# Patient Record
Sex: Male | Born: 1950 | Race: Black or African American | Hispanic: No | Marital: Single | Smoking: Never smoker
Health system: Southern US, Community
[De-identification: ages and names within clinical notes are randomized; demographics above are authoritative.]

## PROBLEM LIST (undated history)

## (undated) DIAGNOSIS — I10 Essential (primary) hypertension: Secondary | ICD-10-CM

## (undated) DIAGNOSIS — M1990 Unspecified osteoarthritis, unspecified site: Secondary | ICD-10-CM

---

## 2002-06-12 ENCOUNTER — Encounter: Payer: Self-pay | Admitting: Emergency Medicine

## 2002-06-12 ENCOUNTER — Emergency Department (HOSPITAL_COMMUNITY): Admission: EM | Admit: 2002-06-12 | Discharge: 2002-06-12 | Payer: Self-pay | Admitting: Emergency Medicine

## 2003-09-07 ENCOUNTER — Emergency Department (HOSPITAL_COMMUNITY): Admission: EM | Admit: 2003-09-07 | Discharge: 2003-09-07 | Payer: Self-pay | Admitting: Emergency Medicine

## 2003-09-13 ENCOUNTER — Encounter: Admission: RE | Admit: 2003-09-13 | Discharge: 2003-09-13 | Payer: Self-pay | Admitting: General Surgery

## 2003-09-13 ENCOUNTER — Encounter: Payer: Self-pay | Admitting: General Surgery

## 2003-09-17 ENCOUNTER — Encounter: Payer: Self-pay | Admitting: General Surgery

## 2003-09-18 ENCOUNTER — Ambulatory Visit (HOSPITAL_COMMUNITY): Admission: RE | Admit: 2003-09-18 | Discharge: 2003-09-18 | Payer: Self-pay | Admitting: General Surgery

## 2008-01-03 ENCOUNTER — Inpatient Hospital Stay (HOSPITAL_COMMUNITY): Admission: RE | Admit: 2008-01-03 | Discharge: 2008-01-05 | Payer: Self-pay | Admitting: Internal Medicine

## 2008-08-05 ENCOUNTER — Emergency Department (HOSPITAL_COMMUNITY): Admission: EM | Admit: 2008-08-05 | Discharge: 2008-08-06 | Payer: Self-pay | Admitting: Emergency Medicine

## 2008-08-16 ENCOUNTER — Emergency Department (HOSPITAL_COMMUNITY): Admission: EM | Admit: 2008-08-16 | Discharge: 2008-08-16 | Payer: Self-pay | Admitting: Emergency Medicine

## 2008-09-08 ENCOUNTER — Emergency Department (HOSPITAL_COMMUNITY): Admission: EM | Admit: 2008-09-08 | Discharge: 2008-09-09 | Payer: Self-pay | Admitting: Emergency Medicine

## 2009-03-11 ENCOUNTER — Emergency Department (HOSPITAL_COMMUNITY): Admission: EM | Admit: 2009-03-11 | Discharge: 2009-03-11 | Payer: Self-pay | Admitting: Emergency Medicine

## 2009-03-12 ENCOUNTER — Emergency Department (HOSPITAL_COMMUNITY): Admission: EM | Admit: 2009-03-12 | Discharge: 2009-03-13 | Payer: Self-pay | Admitting: Emergency Medicine

## 2009-10-06 IMAGING — CT CT ABDOMEN W/ CM
2 of 5 series · 13 of 32 positions shown, 18 images · IV contrast (water/omni  & 100 ML OMNI 300)
Comparison: None

CT ABDOMEN

CLINICAL DATA: Abdominal pain.

CT ABDOMEN AND PELVIS WITH CONTRAST
TECHNIQUE: Multidetector CT imaging of the abdomen and pelvis was
performed using the standard protocol following bolus
administration of intravenous contrast.
Contrast: 100 ml [id number]

[Series 2: routine abdomen · axial · 0.82mm/px · z∈[-426,-131]mm · 5 of 89 slices shown, 10 images]
[im 15/89  soft-tissue]
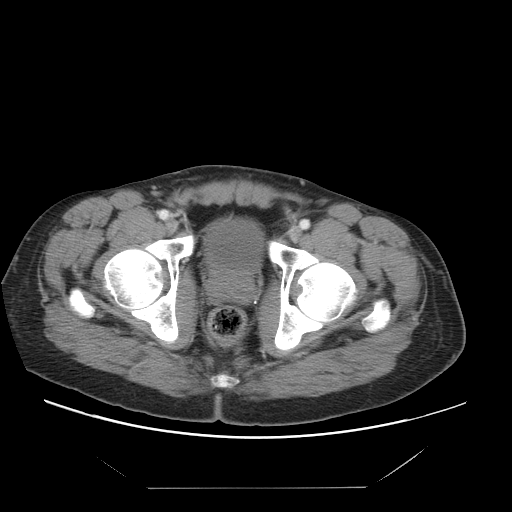
[im 15/89  bone]
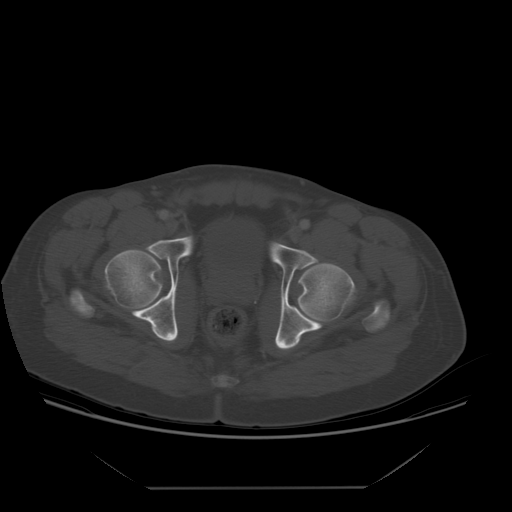
[im 30/89  soft-tissue]
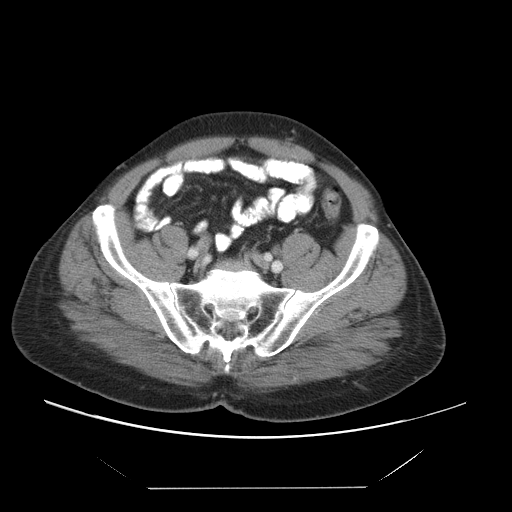
[im 30/89  lung]
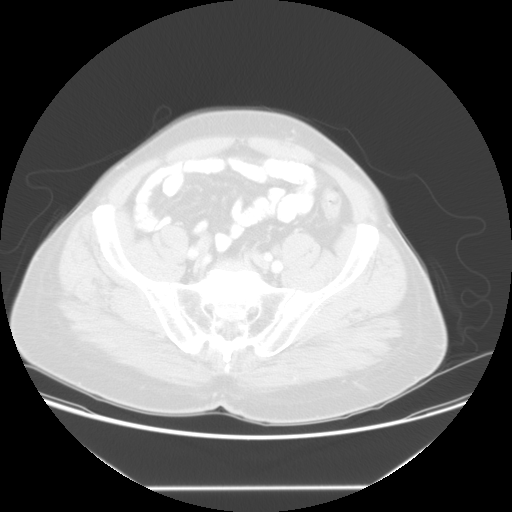
[im 45/89  soft-tissue]
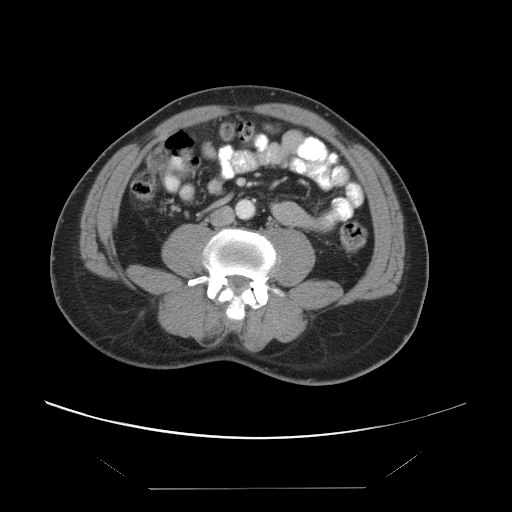
[im 45/89  lung]
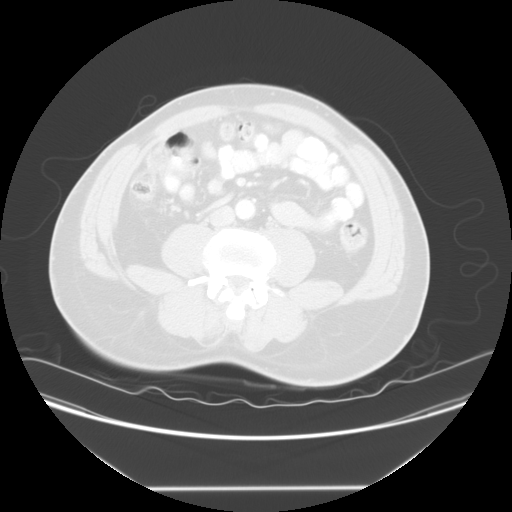
[im 59/89  soft-tissue]
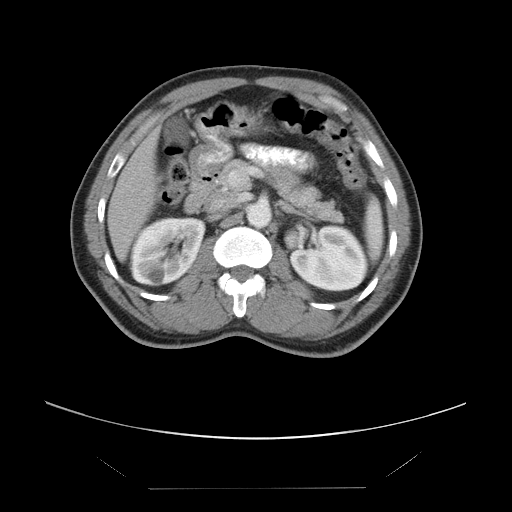
[im 59/89  lung]
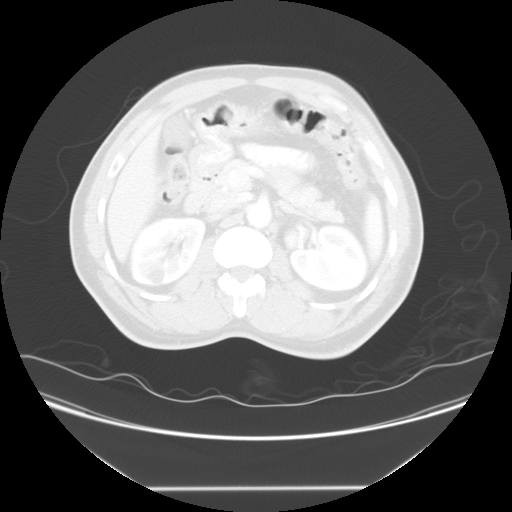
[im 74/89  soft-tissue]
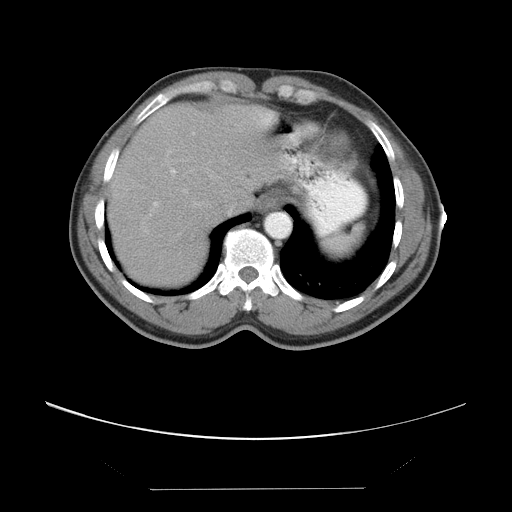
[im 74/89  lung]
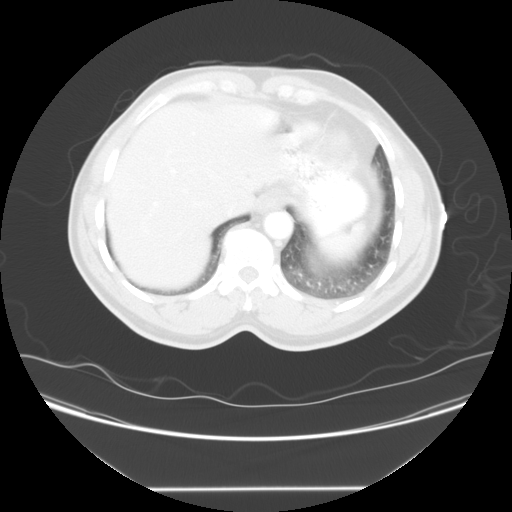

[Series 400: reformatted · sagittal · 0.89mm/px · 8 of 115 slices shown]
[im 13/115  soft-tissue]
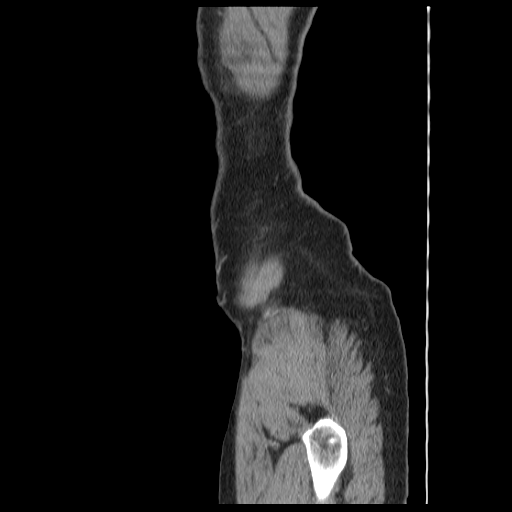
[im 26/115  soft-tissue]
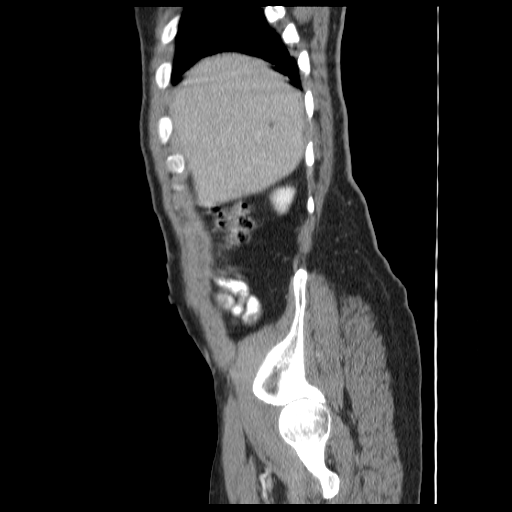
[im 39/115  soft-tissue]
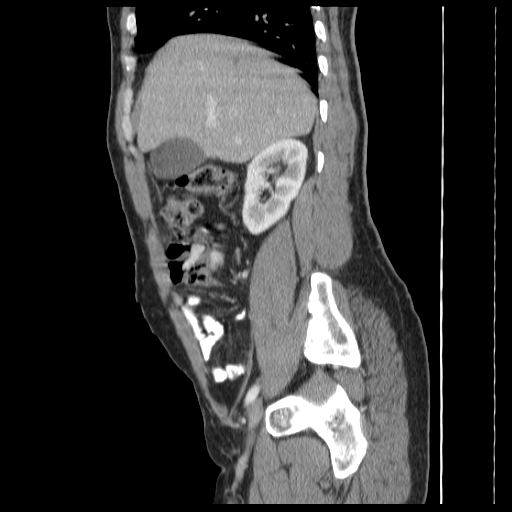
[im 51/115  soft-tissue]
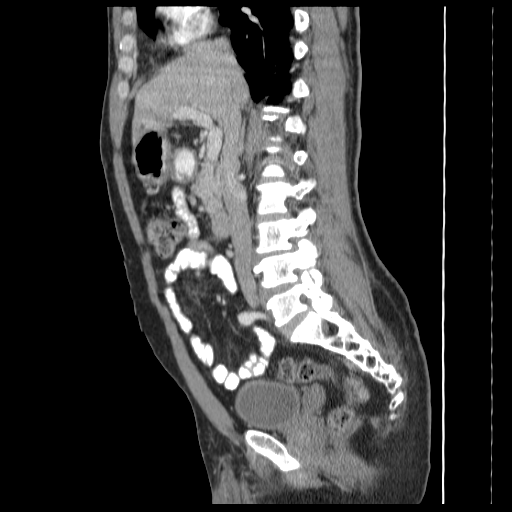
[im 64/115  soft-tissue]
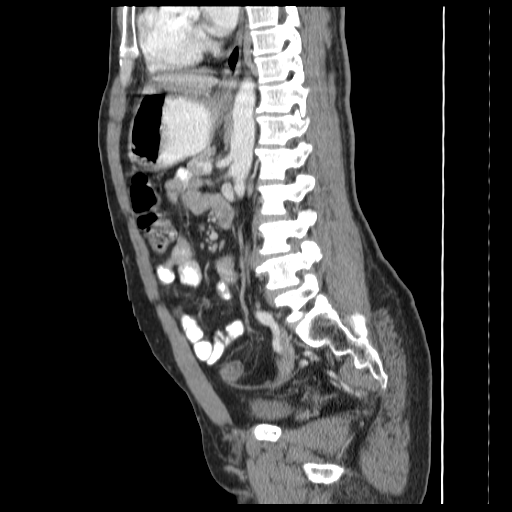
[im 77/115  soft-tissue]
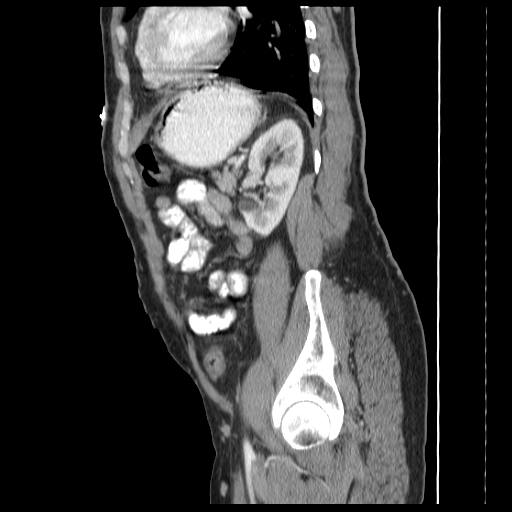
[im 89/115  soft-tissue]
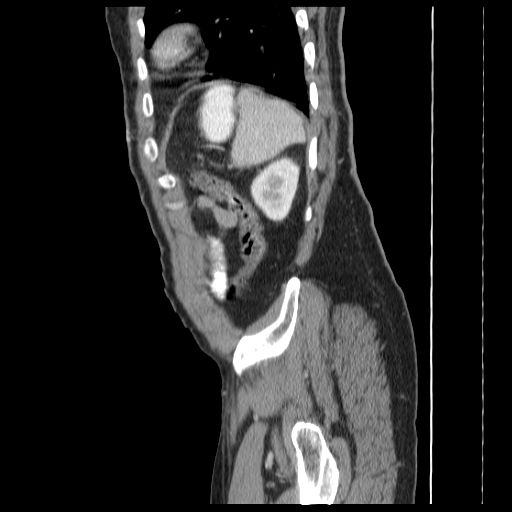
[im 102/115  soft-tissue]
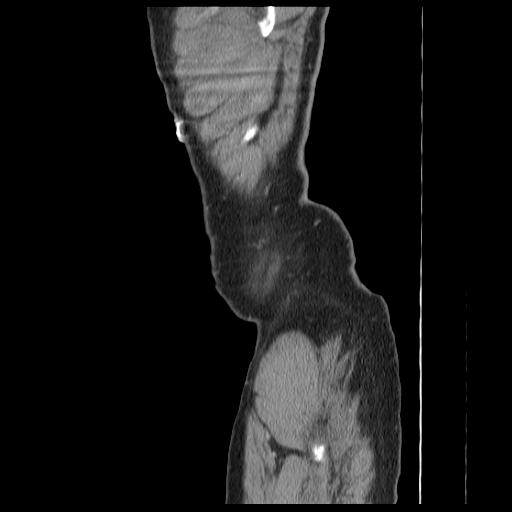

[13 of 32 positions shown; findings below may reference images not displayed]

FINDINGS: Low density areas noted within the liver and both
kidneys, most compatible with cysts.  Spleen, pancreas, adrenals
unremarkable.  Gallbladder grossly unremarkable.

Within the lower abdomen, there is a loop of small bowel which has
apparent area of wall thickening, seen best on image 48.  This
persists on the delayed renal images.  No evidence of obstruction.
No free air or adenopathy.

Minimal dependent atelectasis in the lung bases.  No effusions.  No
acute bony abnormality.
IMPRESSION: Questionable area of focal wall thickening in mid small bowel,
within the lower abdomen on image 48.  This persists on renal
delayed images.  Considerations would include area of focal
enteritis, stricture, less likely mass.  Consider follow-up with CT
enterography after oral contrast material has cleared.

Bilateral renal and hepatic cysts.

CT PELVIS
FINDINGS: Appendix is visualized and is normal.  No free fluid,
free air, or adenopathy.  Bladder grossly unremarkable.  No acute
bony abnormality.
IMPRESSION: No acute findings in the pelvis.

## 2010-01-16 ENCOUNTER — Emergency Department (HOSPITAL_COMMUNITY): Admission: EM | Admit: 2010-01-16 | Discharge: 2010-01-17 | Payer: Self-pay | Admitting: Emergency Medicine

## 2010-05-29 ENCOUNTER — Ambulatory Visit: Payer: Self-pay | Admitting: Internal Medicine

## 2010-07-07 ENCOUNTER — Ambulatory Visit: Payer: Self-pay | Admitting: Family Medicine

## 2010-07-07 LAB — CONVERTED CEMR LAB
ALT: 18 units/L (ref 0–53)
AST: 18 units/L (ref 0–37)
Alkaline Phosphatase: 44 units/L (ref 39–117)
Basophils Absolute: 0 10*3/uL (ref 0.0–0.1)
Basophils Relative: 0 % (ref 0–1)
Calcium: 9.2 mg/dL (ref 8.4–10.5)
Chloride: 105 meq/L (ref 96–112)
Creatinine, Ser: 0.79 mg/dL (ref 0.40–1.50)
Eosinophils Absolute: 0.2 10*3/uL (ref 0.0–0.7)
MCHC: 32.6 g/dL (ref 30.0–36.0)
MCV: 80.1 fL (ref 78.0–100.0)
Monocytes Absolute: 0.5 10*3/uL (ref 0.1–1.0)
Neutro Abs: 2.3 10*3/uL (ref 1.7–7.7)
Neutrophils Relative %: 49 % (ref 43–77)
Potassium: 4.1 meq/L (ref 3.5–5.3)
RDW: 15.1 % (ref 11.5–15.5)

## 2010-08-20 ENCOUNTER — Ambulatory Visit: Payer: Self-pay | Admitting: Internal Medicine

## 2011-03-04 LAB — URINALYSIS, ROUTINE W REFLEX MICROSCOPIC
Bilirubin Urine: NEGATIVE
Leukocytes, UA: NEGATIVE
Nitrite: NEGATIVE
Specific Gravity, Urine: 1.023 (ref 1.005–1.030)
Urobilinogen, UA: 0.2 mg/dL (ref 0.0–1.0)
pH: 8 (ref 5.0–8.0)

## 2011-03-04 LAB — URINE MICROSCOPIC-ADD ON

## 2011-03-04 LAB — COMPREHENSIVE METABOLIC PANEL
Albumin: 3.8 g/dL (ref 3.5–5.2)
Alkaline Phosphatase: 51 U/L (ref 39–117)
BUN: 11 mg/dL (ref 6–23)
CO2: 25 mEq/L (ref 19–32)
Chloride: 102 mEq/L (ref 96–112)
Creatinine, Ser: 0.88 mg/dL (ref 0.4–1.5)
GFR calc non Af Amer: >60 mL/min (ref >60)
Glucose, Bld: 127 mg/dL — ABNORMAL HIGH (ref 70–99)
Potassium: 3.8 mEq/L (ref 3.5–5.1)
Total Bilirubin: 0.9 mg/dL (ref 0.3–1.2)

## 2011-03-04 LAB — CBC
HCT: 49.1 % (ref 39.0–52.0)
Hemoglobin: 16.4 g/dL (ref 13.0–17.0)
MCV: 82.3 fL (ref 78.0–100.0)
RBC: 5.97 MIL/uL — ABNORMAL HIGH (ref 4.22–5.81)
WBC: 6.3 10*3/uL (ref 4.0–10.5)

## 2011-03-04 LAB — DIFFERENTIAL
Basophils Absolute: 0 10*3/uL (ref 0.0–0.1)
Basophils Relative: 1 % (ref 0–1)
Eosinophils Relative: 1 % (ref 0–5)
Lymphocytes Relative: 23 % (ref 12–46)
Monocytes Absolute: 0.6 10*3/uL (ref 0.1–1.0)
Neutro Abs: 4.1 10*3/uL (ref 1.7–7.7)

## 2011-03-04 LAB — POCT CARDIAC MARKERS
CKMB, poc: 3.5 ng/mL (ref 1.0–8.0)
Myoglobin, poc: 95.2 ng/mL (ref 12–200)

## 2011-03-04 LAB — URINE CULTURE

## 2011-03-04 LAB — LIPASE, BLOOD: Lipase: 26 U/L (ref 11–59)

## 2011-03-26 LAB — CBC
HCT: 47.8 % (ref 39.0–52.0)
MCHC: 32.9 g/dL (ref 30.0–36.0)
MCV: 80.3 fL (ref 78.0–100.0)
RBC: 5.95 MIL/uL — ABNORMAL HIGH (ref 4.22–5.81)
WBC: 7 10*3/uL (ref 4.0–10.5)

## 2011-03-26 LAB — DIFFERENTIAL
Basophils Absolute: 0 10*3/uL (ref 0.0–0.1)
Eosinophils Relative: 1 % (ref 0–5)
Lymphocytes Relative: 23 % (ref 12–46)
Neutro Abs: 4.5 10*3/uL (ref 1.7–7.7)
Neutrophils Relative %: 65 % (ref 43–77)

## 2011-03-26 LAB — COMPREHENSIVE METABOLIC PANEL
BUN: 12 mg/dL (ref 6–23)
CO2: 26 mEq/L (ref 19–32)
Calcium: 9.7 mg/dL (ref 8.4–10.5)
Chloride: 104 mEq/L (ref 96–112)
Creatinine, Ser: 0.79 mg/dL (ref 0.4–1.5)
GFR calc non Af Amer: >60 mL/min (ref >60)
Glucose, Bld: 126 mg/dL — ABNORMAL HIGH (ref 70–99)
Total Bilirubin: 0.8 mg/dL (ref 0.3–1.2)

## 2011-03-26 LAB — URINALYSIS, ROUTINE W REFLEX MICROSCOPIC
Bilirubin Urine: NEGATIVE
Glucose, UA: NEGATIVE mg/dL
Hgb urine dipstick: NEGATIVE
Specific Gravity, Urine: 1.01 (ref 1.005–1.030)

## 2011-03-26 LAB — LIPASE, BLOOD: Lipase: 28 U/L (ref 11–59)

## 2011-04-28 NOTE — Discharge Summary (Signed)
NAMEDAHLTON, HINDE NO.:  [id number]   MEDICAL RECORD NO.:  [id number]          PATIENT TYPE:  INP   LOCATION:  1426                         FACILITY:  [facility]   PHYSICIAN:  Altha Harm, MDDATE OF BIRTH:  23-Jun-1951   DATE OF ADMISSION:  01/03/2008  DATE OF DISCHARGE:  01/05/2008                               DISCHARGE SUMMARY   DISCHARGE DISPOSITION:  Home.   FINAL DISCHARGE DIAGNOSIS:  Coffee-grounds emesis, likely gastritis.   DISCHARGE DIAGNOSIS:  Protonix 40 mg p.o. b.i.d.   CONSULTANT:  None.   PROCEDURES:  None.   DIAGNOSTIC STUDIES:  None.   CHIEF COMPLAINT:  Coffee-grounds emesis.   ALLERGIES:  No known drug allergies.   HISTORY OF PRESENT ILLNESS:  Please see the H&P dictated by Dr. Mikeal Hawthorne  for details of the HPI.   However, in short, this is a gentleman from [place] who was sent as a  direct admission by Dr. Mikeal Hawthorne after he presented with complaints of  coffee-grounds emesis.   HOSPITAL COURSE:  The patient was admitted and placed on bowel rest.  His hemoglobin was serially monitored over 24 hours.  His hemoglobins  remained stable at 14.  He was started on a diet and the patient's diet  was advanced to a heart-healthy diet.  The patient has tolerated the  diet without any evidence of bleeding and without any hemodynamic  compromise.  The patient's condition has been discussed with Dr. Mikeal Hawthorne,  who at this time will have the patient worked up as gastritis as an  outpatient.   DISCHARGE MEDICATIONS:  The patient is being discharged home on Protonix  p.o. b.i.d.   FOLLOWUP:  He is to follow up with Dr. Mikeal Hawthorne in 3-5 days and the plan is  for him to have a gastrointestinal outpatient workup for his condition.   DIETARY RESTRICTIONS:  None.   PHYSICAL RESTRICTIONS:  None.      Altha Harm, MD  Electronically Signed     MAM/MEDQ  D:  01/05/2008  T:  01/05/2008  Job:  [id number]   cc:   Lonia Blood, M.D.

## 2011-04-28 NOTE — H&P (Signed)
NAMEQUINDON, DENKER NO.:  [id number]   MEDICAL RECORD NO.:  [id number]          PATIENT TYPE:  INP   LOCATION:  1426                         FACILITY:  [facility]   PHYSICIAN:  Lonia Blood, M.D.      DATE OF BIRTH:  1951/10/22   DATE OF ADMISSION:  01/03/2008  DATE OF DISCHARGE:                              HISTORY & PHYSICAL   PRIMARY CARE PHYSICIAN:  Dr. Mikeal Hawthorne.   PRESENTING COMPLAINT:  Nausea, vomiting and hematemesis.   HISTORY OF PRESENT ILLNESS:  The patient is a 60 year old gentleman with  past history of inguinal hernia status post repair in 2004 who presented  with persistent abdominal pain, nausea and vomiting.  The patient has  been taking Pepto-Bismol apparently for abdominal pain.  He has noticed  continuous blood  in his vomitus.  He has become so weak today that he  is unable to walk around or move around.  The patient was brought in by  his friends who had to hold him.  He was admitted to the hospital for  further management.   PAST MEDICAL HISTORY:  Significant only for hernia repair and recurrent  abdominal pain.   ALLERGIES:  He has NO KNOWN DRUG ALLERGIES.   MEDICATIONS:  None.   SOCIAL HISTORY:  The patient is married with two wives.  Denied any  tobacco, alcohol or IV drug use.  He is originally from [place].   FAMILY HISTORY:  Denied any significant history of cancer, diabetes,  hypertension.   REVIEW OF SYSTEMS:  A 12 point review of systems is negative except per  HPI.   PHYSICAL EXAMINATION:  The patient is afebrile with temperature 98.3,  blood pressure 130/84, pulse 88, respiratory rate 16 with sats 99% on  room air.  GENERAL:  The patient looks acutely ill looking in no acute distress.  HEENT: PERRL.  EOMI.  NECK:  Supple.  No JVD, no lymphadenopathy.  RESPIRATORY:  He has good air entry bilaterally.  No wheezes or rales.  CARDIOVASCULAR:  The patient has S1-S2, no murmurs.  ABDOMEN:  Flat, soft with some mild epigastric  tenderness with some  positive bowel sounds.  EXTREMITIES:  No edema, cyanosis or clubbing.   LABORATORY DATA:  Labs showed sodium 134, potassium 4.1, chloride 101,  CO2 27, glucose 125, BUN 11, creatinine 0.92 and calcium 9.0.  White  count is 8.5, hemoglobin 15.9, platelet count 213.   ASSESSMENT:  Therefore this is a 60 year old gentleman presenting with  nausea, vomiting, abdominal pain.  Also reported some elements of  hematemesis.   PLAN:  Therefore will be:  1. Nausea and vomiting.  This most likely represents some form of      gastritis.  The patient has been having GI symptoms and taking      Pepto-Bismol which has some aspirin.  This may therefore reflect a      case of NSAID induced gastritis or esophagitis.  At this point will      admit him.  Start Phenergan p.r.n., IV fluids and keep him n.p.o.  We will gradually start him on clear liquids and advance his diet.  2. Reported hematemesis.  The patient is currently guaiac negative.      His hemoglobin is 15.9 showing probably dehydration.  We will      follow serial him CBCs.  If he showed any hematemesis in the      hospital or if his hemoglobin drops significantly will get GI for      EGD.  In the meantime, however, we will put him on Protonix p.o.      b.i.d. and continue with pain control.  3. Dehydration.  Again, we will hydrate the patient adequately until      discharge.      Lonia Blood, M.D.  Electronically Signed     LG/MEDQ  D:  01/04/2008  T:  01/05/2008  Job:  [id number]

## 2011-05-01 NOTE — Op Note (Signed)
Gary Quinn, Gary Quinn NO.:  [id number]   MEDICAL RECORD NO.:  [id number]                   PATIENT TYPE:  AMB   LOCATION:  DAY                                  FACILITY:  [facility]   PHYSICIAN:  Sharlet Salina T. Hoxworth, M.D.          DATE OF BIRTH:  [birth year]   DATE OF PROCEDURE:  09/18/2003  DATE OF DISCHARGE:                                 OPERATIVE REPORT   PREOPERATIVE DIAGNOSIS:  Right inguinal hernia.   POSTOPERATIVE DIAGNOSIS:  Right inguinal hernia.   OPERATION/PROCEDURE:  Repair of right inguinal hernia with mesh.   SURGEON:  Lorne Skeens. Hoxworth, M.D.   ANESTHESIA:  Laryngeal mask general.   BRIEF HISTORY:  [name] is a 60 year old black male from [place] who presents  with a three-month history of right groin pain worse with activity.  Exam  reveals a right inguinal hernia.  Repair with general anesthesia with mesh  has been recommended and accepted.  The nature of the procedure,  indications, risks of bleeding, infection, recurrence was discussed and  understood.  He now comes to the operating room for this procedure.  The  patient was brought to the operating room and placed in the supine position  on the operating table and laryngeal mask general anesthesia was induced.  Preoperative antibiotics were given intravenously.  The right groin was  sterilely prepped and draped.  Oblique incision was made in the groin.  Dissection was carried down through the subcutaneous tissue using cautery.  External oblique was identified and divided along the lines of its fibers  through the external ring.  The cord was dissected off the floor of the  pubic tubercle and completely dissected free back up to the internal ring  dividing cremasteric fibers bilaterally.  Dissection within the cord  revealed a good sized herniated portion of retroperitoneal fat or cord  lipoma and this was completely dissected free from the cord structures up to  and above the  level of the internal ring where it was clamped, divided and  tied with 3-0 Vicryl.  Careful dissection within the cord showed no evidence  of an indirect sac and the floor was intact.  A piece of Parietex mesh was  trimmed to size to fit the floor of the inguinal canal, with tails around  the cord of the internal ring.  It was sutured to the pubic tubercle and  then to the inguinal ligament working medial to lateral with running 2-0  Prolene, medially sutured to the edge of the rectus sheath with interrupted  2-0 Prolene.  The tails were then tacked together laterally to the cord  with interrupted 2-0 Prolene.  The soft tissue was infiltrated with  Marcaine.  The cord and ilioinguinal nerves were returned to the anatomic  position.  The external oblique was returned to their anatomic positions.  The external oblique was closed with plain 3-0 Vicryl.  Scarpa's fascia was  closed with plain 3-0 Vicryl.  The skin was closed with running subcuticular  4-0 Monocryl and Dermabond.  Sponge and needle counts were correct.  Dry  dressing was applied and the patient was taken to recovery in good  condition.                                                 Lorne Skeens. Hoxworth, M.D.    Tory Emerald  D:  09/18/2003  T:  09/18/2003  Job:  [id number]

## 2011-07-14 ENCOUNTER — Emergency Department (HOSPITAL_COMMUNITY): Payer: Self-pay

## 2011-07-14 ENCOUNTER — Emergency Department (HOSPITAL_COMMUNITY)
Admission: EM | Admit: 2011-07-14 | Discharge: 2011-07-14 | Disposition: A | Discharge status: Home / Self Care | Payer: Self-pay | Attending: Emergency Medicine | Admitting: Emergency Medicine

## 2011-07-14 DIAGNOSIS — Z79899 Other long term (current) drug therapy: Secondary | ICD-10-CM | POA: Insufficient documentation

## 2011-07-14 DIAGNOSIS — R071 Chest pain on breathing: Secondary | ICD-10-CM | POA: Insufficient documentation

## 2011-07-14 LAB — DIFFERENTIAL
Basophils Absolute: 0 10*3/uL (ref 0.0–0.1)
Basophils Relative: 0 % (ref 0–1)
Eosinophils Absolute: 0.1 10*3/uL (ref 0.0–0.7)
Eosinophils Relative: 3 % (ref 0–5)
Lymphocytes Relative: 42 % (ref 12–46)
Lymphs Abs: 2.1 10*3/uL (ref 0.7–4.0)
Monocytes Absolute: 0.5 10*3/uL (ref 0.1–1.0)
Monocytes Relative: 11 % (ref 3–12)
Neutro Abs: 2.3 10*3/uL (ref 1.7–7.7)
Neutrophils Relative %: 45 % (ref 43–77)

## 2011-07-14 LAB — CBC
HCT: 42.9 % (ref 39.0–52.0)
Hemoglobin: 14.6 g/dL (ref 13.0–17.0)
MCH: 26.4 pg (ref 26.0–34.0)
MCHC: 34 g/dL (ref 30.0–36.0)
MCV: 77.6 fL — ABNORMAL LOW (ref 78.0–100.0)
Platelets: 164 10*3/uL (ref 150–400)
RBC: 5.53 MIL/uL (ref 4.22–5.81)
RDW: 14 % (ref 11.5–15.5)
WBC: 5.1 10*3/uL (ref 4.0–10.5)

## 2011-07-14 LAB — COMPREHENSIVE METABOLIC PANEL
Albumin: 3.6 g/dL (ref 3.5–5.2)
BUN: 17 mg/dL (ref 6–23)
Creatinine, Ser: 0.87 mg/dL (ref 0.50–1.35)
Potassium: 3.6 mEq/L (ref 3.5–5.1)
Total Protein: 7.2 g/dL (ref 6.0–8.3)

## 2011-07-14 LAB — CK TOTAL AND CKMB (NOT AT ARMC)
CK, MB: 7.4 ng/mL (ref 0.3–4.0)
Relative Index: 2.2 (ref 0.0–2.5)

## 2011-07-14 MED ORDER — IOHEXOL 300 MG/ML  SOLN
75.0000 mL | Freq: Once | INTRAMUSCULAR | Status: AC | PRN
  Administered 2011-07-14: 75 mL via INTRAVENOUS

## 2011-09-03 LAB — CBC
HCT: 42.7
HCT: 43.2
HCT: 43.9
Hemoglobin: 14.4
Hemoglobin: 14.8
Hemoglobin: 15.9
MCV: 77.4 — ABNORMAL LOW
MCV: 78.3
Platelets: 193
Platelets: 206
Platelets: 214
RBC: 6.06 — ABNORMAL HIGH
RDW: 14.9
RDW: 14.9
RDW: 15
WBC: 4.4
WBC: 7.1
WBC: 8.5

## 2011-09-03 LAB — BASIC METABOLIC PANEL
BUN: 8
CO2: 27
Chloride: 101
Chloride: 102
GFR calc Af Amer: >60
GFR calc non Af Amer: >60
Glucose, Bld: 141 — ABNORMAL HIGH
Potassium: 3.4 — ABNORMAL LOW
Potassium: 3.9
Sodium: 134 — ABNORMAL LOW
Sodium: 139

## 2011-09-03 LAB — CROSSMATCH: Antibody Screen: NEGATIVE

## 2011-09-03 LAB — ABO/RH: ABO/RH(D): A POS

## 2011-09-16 LAB — COMPREHENSIVE METABOLIC PANEL
ALT: 12
Alkaline Phosphatase: 54
CO2: 26
Calcium: 9.4
Chloride: 105
GFR calc non Af Amer: >60
Glucose, Bld: 96
Potassium: 3.8
Sodium: 139
Total Bilirubin: 0.4

## 2011-09-16 LAB — URINALYSIS, ROUTINE W REFLEX MICROSCOPIC
Bilirubin Urine: NEGATIVE
Hgb urine dipstick: NEGATIVE
Nitrite: NEGATIVE
Specific Gravity, Urine: 1.023
pH: 5.5

## 2011-09-16 LAB — CBC
Hemoglobin: 14.6
MCHC: 33.3
RBC: 5.43
WBC: 5.2

## 2011-09-16 LAB — DIFFERENTIAL
Basophils Absolute: 0
Basophils Relative: 1
Eosinophils Absolute: 0.3
Neutrophils Relative %: 54

## 2011-09-16 LAB — LIPASE, BLOOD: Lipase: 27

## 2013-04-03 ENCOUNTER — Emergency Department (HOSPITAL_COMMUNITY)
Admission: EM | Admit: 2013-04-03 | Discharge: 2013-04-03 | Disposition: A | Discharge status: Home / Self Care | Payer: No Typology Code available for payment source | Attending: Emergency Medicine | Admitting: Emergency Medicine

## 2013-04-03 ENCOUNTER — Encounter (HOSPITAL_COMMUNITY): Payer: Self-pay | Admitting: *Deleted

## 2013-04-03 DIAGNOSIS — I10 Essential (primary) hypertension: Secondary | ICD-10-CM | POA: Insufficient documentation

## 2013-04-03 DIAGNOSIS — R110 Nausea: Secondary | ICD-10-CM | POA: Insufficient documentation

## 2013-04-03 DIAGNOSIS — R197 Diarrhea, unspecified: Secondary | ICD-10-CM | POA: Insufficient documentation

## 2013-04-03 DIAGNOSIS — R1084 Generalized abdominal pain: Secondary | ICD-10-CM | POA: Insufficient documentation

## 2013-04-03 DIAGNOSIS — Z8739 Personal history of other diseases of the musculoskeletal system and connective tissue: Secondary | ICD-10-CM | POA: Insufficient documentation

## 2013-04-03 DIAGNOSIS — R51 Headache: Secondary | ICD-10-CM | POA: Insufficient documentation

## 2013-04-03 DIAGNOSIS — R109 Unspecified abdominal pain: Secondary | ICD-10-CM

## 2013-04-03 HISTORY — DX: Essential (primary) hypertension: I10

## 2013-04-03 HISTORY — DX: Unspecified osteoarthritis, unspecified site: M19.90

## 2013-04-03 LAB — COMPREHENSIVE METABOLIC PANEL
ALT: 18 U/L (ref 0–53)
AST: 21 U/L (ref 0–37)
Albumin: 3.6 g/dL (ref 3.5–5.2)
CO2: 27 mEq/L (ref 19–32)
Calcium: 9.3 mg/dL (ref 8.4–10.5)
Chloride: 103 mEq/L (ref 96–112)
GFR calc non Af Amer: >90 mL/min (ref >90)
Sodium: 139 mEq/L (ref 135–145)
Total Bilirubin: 0.3 mg/dL (ref 0.3–1.2)

## 2013-04-03 LAB — CBC WITH DIFFERENTIAL/PLATELET
Basophils Absolute: 0 10*3/uL (ref 0.0–0.1)
Basophils Relative: 0 % (ref 0–1)
Lymphocytes Relative: 34 % (ref 12–46)
MCHC: 35.3 g/dL (ref 30.0–36.0)
Neutro Abs: 2.6 10*3/uL (ref 1.7–7.7)
Platelets: 168 10*3/uL (ref 150–400)
RDW: 13.9 % (ref 11.5–15.5)
WBC: 4.9 10*3/uL (ref 4.0–10.5)

## 2013-04-03 LAB — URINALYSIS, MICROSCOPIC ONLY
Bilirubin Urine: NEGATIVE
Glucose, UA: NEGATIVE mg/dL
Hgb urine dipstick: NEGATIVE
Specific Gravity, Urine: 1.012 (ref 1.005–1.030)
Urobilinogen, UA: 0.2 mg/dL (ref 0.0–1.0)

## 2013-04-03 MED ORDER — DOCUSATE CALCIUM 240 MG PO CAPS: 240.0000 mg | ORAL_CAPSULE | Freq: Two times a day (BID) | ORAL | Status: DC

## 2013-04-03 MED ORDER — PROCHLORPERAZINE MALEATE 10 MG PO TABS
10.0000 mg | ORAL_TABLET | Freq: Once | ORAL | Status: AC
  Administered 2013-04-03: 10 mg via ORAL
  Filled 2013-04-03: qty 1

## 2013-04-03 MED ORDER — DIPHENHYDRAMINE HCL 25 MG PO CAPS
25.0000 mg | ORAL_CAPSULE | Freq: Once | ORAL | Status: AC
  Administered 2013-04-03: 25 mg via ORAL
  Filled 2013-04-03: qty 1

## 2013-04-03 NOTE — ED Notes (Addendum)
Pt c/o headache, abd pain and diarrhea since last night.  Pt states he has had bad headaches like this in the past that leave after 3 days.  C/o fevers as well, though afebrile in triage. He appears in great pain.  Pt speaks Hausa.

## 2013-04-03 NOTE — ED Provider Notes (Signed)
Medical screening examination/treatment/procedure(s) were performed by non-physician practitioner and as supervising physician I was immediately available for consultation/collaboration.  Flint Melter, MD 04/03/13 [phone]

## 2013-04-03 NOTE — ED Provider Notes (Signed)
History     CSN: [id number]  Arrival date & time 04/03/13  1133   First MD Initiated Contact with Patient 04/03/13 1307      Chief Complaint  Patient presents with  . Headache  . Abdominal Pain  . Diarrhea    (Consider location/radiation/quality/duration/timing/severity/associated sxs/prior treatment) HPI  History was obtained via the use of a translator   The patient is a 62 year old male with a past medical history significant for chronic abdominal pain presenting to ED with 2 days of flareup of generalized waxing and waning abdominal pain without radiation. Patient has associated generalized occipital headache without visual disturbance or neural focal deficits along with 4 episodes of watery nonbloody non-coffee ground appearing diarrhea today. Patient has been worked up in past for this presentation but states no doctor has ever given him a definitive diagnosis he typically does get pain medication and "is given a letter from the doctor when he is discharged from ED" but does not understand what the letter states. Denies fever, chills, vomiting, chest pain, shortness of breath, blood in the stools, urinary symptoms, visual disturbances, weakness, numbness, tingling, gait disturbances.  Past Medical History  Diagnosis Date  . Hypertension   . Arthritis     History reviewed. No pertinent past surgical history.  No family history on file.  History  Substance Use Topics  . Smoking status: Never Smoker   . Smokeless tobacco: Not on file  . Alcohol Use: No      Review of Systems  Constitutional: Negative.   Respiratory: Negative.   Cardiovascular: Negative.   Gastrointestinal: Positive for nausea, abdominal pain and diarrhea. Negative for vomiting, constipation, blood in stool, anal bleeding and rectal pain.  Genitourinary: Negative for dysuria, frequency, hematuria and difficulty urinating.  Musculoskeletal: Negative.   Skin: Negative.   Neurological: Positive for  headaches. Negative for tremors, seizures, syncope, facial asymmetry, speech difficulty, weakness, light-headedness and numbness.    Allergies  Aspirin  Home Medications  No current outpatient prescriptions on file.  BP 135/88  Pulse 71  Temp(Src) 98.2 F (36.8 C) (Oral)  Resp 20  SpO2 97%  Physical Exam  Constitutional: He is oriented to person, place, and time. He appears well-developed and well-nourished.  HENT:  Head: Normocephalic and atraumatic.  Mouth/Throat: Oropharynx is clear and moist. No oropharyngeal exudate.  Eyes: EOM are normal. Right eye exhibits no discharge. Left eye exhibits no discharge.  Neck: Normal range of motion. Neck supple. No tracheal deviation present.  Cardiovascular: Normal rate, regular rhythm and normal heart sounds.   Pulmonary/Chest: Effort normal and breath sounds normal. No respiratory distress. He has no wheezes.  Abdominal: Soft. Bowel sounds are normal. There is no tenderness. There is no rigidity, no rebound, no guarding, no CVA tenderness, no tenderness at McBurney's point and negative Murphy's sign.  Lymphadenopathy:    He has no cervical adenopathy.  Neurological: He is alert and oriented to person, place, and time. He has normal strength. No cranial nerve deficit or sensory deficit. Gait normal.  Skin: Skin is warm and dry. No rash noted.  Psychiatric: He has a normal mood and affect.    ED Course  Procedures (including critical care time)  Medications  prochlorperazine (COMPAZINE) tablet 10 mg (10 mg Oral Given 04/03/13 1512)  diphenhydrAMINE (BENADRYL) capsule 25 mg (25 mg Oral Given 04/03/13 1512)     Labs Reviewed  CBC WITH DIFFERENTIAL - Abnormal; Notable for the following:    MCV 77.7 (*)    All  other components within normal limits  COMPREHENSIVE METABOLIC PANEL - Abnormal; Notable for the following:    Glucose, Bld 139 (*)    All other components within normal limits  URINALYSIS, MICROSCOPIC ONLY   No results  found.   1. Abdominal pain   2. Diarrhea   3. Headache       MDM  Patient is nontoxic, nonseptic appearing, in no apparent distress.  Patient's chronic abdominal pain and other symptoms adequately managed in emergency department.  Fluid bolus given.  Labs  vitals were reviewed. No imaging warranted at this time given chronic presentation w/ past negative work ups and labs wnl. Patient does not meet the SIRS or Sepsis criteria.  On repeat exam patient does not have a surgical abdomin and there are nor peritoneal signs.  No indication of appendicitis, bowel obstruction, bowel perforation, cholecystitis, diverticulitis.  Patient discharged home with symptomatic treatment and given strict instructions for follow-up with their primary care physician.  I have also discussed reasons to return immediately to the ER.  Patient expresses understanding and agrees with plan. Advised to follow up with PCP. Patient d/w with Dr. Effie Shy, agrees with plan. Patient is stable at time of discharge             Jeannetta Ellis, PA-C 04/03/13 1601

## 2013-06-01 ENCOUNTER — Emergency Department (HOSPITAL_COMMUNITY)
Admission: EM | Admit: 2013-06-01 | Discharge: 2013-06-02 | Disposition: A | Discharge status: Home / Self Care | Payer: Worker's Compensation | Attending: Emergency Medicine | Admitting: Emergency Medicine

## 2013-06-01 ENCOUNTER — Encounter (HOSPITAL_COMMUNITY): Payer: Self-pay | Admitting: *Deleted

## 2013-06-01 DIAGNOSIS — I10 Essential (primary) hypertension: Secondary | ICD-10-CM | POA: Insufficient documentation

## 2013-06-01 DIAGNOSIS — L03213 Periorbital cellulitis: Secondary | ICD-10-CM

## 2013-06-01 DIAGNOSIS — H05019 Cellulitis of unspecified orbit: Secondary | ICD-10-CM | POA: Insufficient documentation

## 2013-06-01 DIAGNOSIS — Z8739 Personal history of other diseases of the musculoskeletal system and connective tissue: Secondary | ICD-10-CM | POA: Insufficient documentation

## 2013-06-01 DIAGNOSIS — H5710 Ocular pain, unspecified eye: Secondary | ICD-10-CM | POA: Diagnosis present

## 2013-06-01 LAB — BASIC METABOLIC PANEL
BUN: 16 mg/dL (ref 6–23)
GFR calc Af Amer: >90 mL/min (ref >90)
GFR calc non Af Amer: >90 mL/min (ref >90)
Potassium: 4 mEq/L (ref 3.5–5.1)
Sodium: 138 mEq/L (ref 135–145)

## 2013-06-01 LAB — CBC WITH DIFFERENTIAL/PLATELET
Basophils Relative: 0 % (ref 0–1)
HCT: 45.3 % (ref 39.0–52.0)
Hemoglobin: 15.2 g/dL (ref 13.0–17.0)
Lymphocytes Relative: 32 % (ref 12–46)
Lymphs Abs: 1.7 10*3/uL (ref 0.7–4.0)
MCHC: 33.6 g/dL (ref 30.0–36.0)
Monocytes Absolute: 0.7 10*3/uL (ref 0.1–1.0)
Monocytes Relative: 13 % — ABNORMAL HIGH (ref 3–12)
Neutro Abs: 2.8 10*3/uL (ref 1.7–7.7)
Neutrophils Relative %: 53 % (ref 43–77)
RBC: 5.71 MIL/uL (ref 4.22–5.81)

## 2013-06-01 LAB — POCT I-STAT, CHEM 8
BUN: 64 mg/dL — ABNORMAL HIGH (ref 6–23)
Chloride: 97 mEq/L (ref 96–112)
Creatinine, Ser: 1.4 mg/dL — ABNORMAL HIGH (ref 0.50–1.35)
Glucose, Bld: 115 mg/dL — ABNORMAL HIGH (ref 70–99)
Hemoglobin: 7.1 g/dL — ABNORMAL LOW (ref 13.0–17.0)
Potassium: 4.2 mEq/L (ref 3.5–5.1)
TCO2: 34 mmol/L (ref 0–100)

## 2013-06-01 MED ORDER — TETRACAINE HCL 0.5 % OP SOLN
1.0000 [drp] | Freq: Once | OPHTHALMIC | Status: AC
  Administered 2013-06-01: 2 [drp] via OPHTHALMIC
  Filled 2013-06-01: qty 2

## 2013-06-01 MED ORDER — FLUORESCEIN SODIUM 1 MG OP STRP
1.0000 | ORAL_STRIP | Freq: Once | OPHTHALMIC | Status: AC
  Administered 2013-06-01: 1 via OPHTHALMIC
  Filled 2013-06-01: qty 1

## 2013-06-01 NOTE — ED Provider Notes (Signed)
History    This chart was scribed for Gary Quinn, non-physician practitioner working with Flint Melter, MD by Leone Payor, ED Scribe. This patient was seen in room TR04C/TR04C and the patient's care was started at 1916.   CSN: [id number]  Arrival date & time 06/01/13  1916   First MD Initiated Contact with Patient 06/01/13 1938      Chief Complaint  Patient presents with  . Eye Problem     The history is provided by the patient and a friend. The history is limited by a language barrier. A language interpreter was used.    HPI Comments: Gary Quinn is a 62 y.o. male who presents to the Emergency Department complaining of new, constant, gradually improving, bilateral eye pain and irritation starting today. 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 Describes the pain as "pain." States his eyes started hurting when wind and dust blew into his face.  States that it is currently much better than is was. Also notes that he has occasional blurry vision whenever he gets around chemicals and needs his glasses. Denies visual changes. Does not have his glasses currently. States both eyes hurt after the "wind" and "gas" hit his eye.  Supervisor is also present and states that when he first saw the patient this morning he did not have any swelling over his right eye and that this is abnormal for him.  He denies any known injury but believes pt was around "bleach fumes"- states pt does have safety goggles he wears.    Level V caveat for extreme language difficulty - we have the translator phone but even with this the story is unclear.  Suspect pt is a poor historian. Pt is from [place] and speaks a local language called [place].    Pt has eye doctor in [place], appointment 06/19/13. (card in patient's wallet)  Past Medical History  Diagnosis Date  . Hypertension   . Arthritis     History reviewed. No pertinent past surgical history.  History reviewed. No pertinent family history.  History  Substance Use Topics  . Smoking status: Never Smoker   . Smokeless tobacco: Not on file  . Alcohol Use: No      Review of Systems  Eyes: Positive for pain.  Level V caveat.   No recent illness.    Allergies  Aspirin  Home Medications  No current outpatient prescriptions on file.  BP 151/79  Pulse 74  Temp(Src) 98.4 F (36.9 C) (Oral)  Resp 16  Ht 5\' 7"  (1.702 m)  Wt 169 lb 7 oz (76.856 kg)  BMI 26.53 kg/m2  SpO2 96%  Physical Exam  Nursing note and vitals reviewed. Constitutional: He appears well-developed and well-nourished. No distress.  HENT:  Head: Normocephalic and atraumatic.  Mouth/Throat: Oropharynx is clear and moist. No oropharyngeal exudate.  Eyes: EOM are normal. Right eye exhibits discharge. Right conjunctiva is injected. Right conjunctiva has no hemorrhage. Left conjunctiva is injected. Left conjunctiva has no hemorrhage. No scleral icterus.  Slit lamp exam:      The left eye shows no corneal abrasion, no corneal flare, no corneal ulcer, no foreign body and no fluorescein uptake.    PH 7 bilaterally Right eye  with clear discharge.  Yellow discharge on eyelashes.  Right eye is injected.  Right eyelid edema is nontender.    Unable to test right eye with fluoroscein given large amount of lid edema.     Neck: Neck supple.  Cardiovascular: Normal rate and regular rhythm.   Pulmonary/Chest: Effort normal and breath sounds normal. No stridor. No respiratory distress. He has no wheezes. He has no rales.  Lymphadenopathy:    He has no cervical adenopathy.  Neurological: He is alert.  Skin: He is not diaphoretic.    ED Course  Procedures (including critical care time)  DIAGNOSTIC STUDIES: Oxygen Saturation  is 96% on RA, adequate by my interpretation.    COORDINATION OF CARE: 8:33 PM Discussed treatment plan with pt at bedside and pt agreed to plan.   Labs Reviewed  CBC WITH DIFFERENTIAL - Abnormal; Notable for the following:    Monocytes Relative 13 (*)    All other components within normal limits  BASIC METABOLIC PANEL - Abnormal; Notable for the following:    Glucose, Bld 107 (*)    All other components within normal limits  POCT I-STAT, CHEM 8 - Abnormal; Notable for the following:    BUN 64 (*)    Creatinine, Ser 1.40 (*)    Glucose, Bld 115 (*)    Calcium, Ion 1.12 (*)    Hemoglobin 7.1 (*)    HCT 21.0 (*)    All other components within normal limits   No results found.\  I suspect that the Chem-8 reported did not belong to this patient.  CBC and BMP are normal.     No diagnosis found.    MDM  Pt with bilateral eye irritation after "wind" and I believe chemical fumes irritating his eyes.  His pH is normal and there is no evidence of corneal abrasion.  No visualized FB.  Right eyelid is edematous but nontender.  Pt does have some abnormal discharge from this eye.  Patient has baseline no vision in this eye.  The history is severely limited by language barrier, despite having the pacific interpreters available.  I have ordered a CT scan of the orbits to r/o orbital cellulitis, mostly because of the language barrier.  If pt truly does not have tenderness with palpation of periorbital area and no pain with EOMs, I doubt orbital cellulitis.  Labs are normal, WBC is normal.  I have discussed the patient with Kyung Bacca, PA-C, who assumes care of patient at change of shift.  Pt has an Art therapist (see above), whom I would like for him to see in the next 1-2 days.  At a minimum, I think the patient should be sent home with PO abx and antibiotic   His supervisor brought him in and is willing to be called afterward to pick him up: [name] [phone] If he is unavailable, [employer] will take responsibility for picking him up [phone] tell them "the dishwasher" that was brought in.  The patient's language is called "Hausa."      I personally performed the services described in this documentation, which was scribed in my presence. The recorded information has been reviewed and is accurate.   Gary Dredge, PA-C 06/02/13 0018  Gary Dredge, PA-C 06/02/13 0121

## 2013-06-01 NOTE — ED Notes (Addendum)
Pt speaks local language or [place], interpreter phone used. 4 years ago at his work in [place] he had "ditch" in his right eye and has not been able to see from his right eye since then. Pt has pain in that eye and also his left eye from something in his eye again today at work. Pt also states that there is fluid coming from his right ear sometimes. Pt has been seeing an eye MD for three years and has been putting drops in his eyes, but wants to be seen for the new occurrence of something in is eye today.

## 2013-06-01 NOTE — ED Notes (Signed)
Fluorescein and pH strips at bedside

## 2013-06-01 NOTE — ED Notes (Signed)
Pt reports fever two days ago with abdominal pain, states this has subsided today

## 2013-06-01 NOTE — ED Notes (Signed)
Pt's Primary Language is Hausa

## 2013-06-02 ENCOUNTER — Encounter (HOSPITAL_COMMUNITY): Payer: Self-pay | Admitting: Radiology

## 2013-06-02 ENCOUNTER — Emergency Department (HOSPITAL_COMMUNITY): Payer: Worker's Compensation

## 2013-06-02 MED ORDER — CLINDAMYCIN HCL 150 MG PO CAPS
300.0000 mg | ORAL_CAPSULE | Freq: Once | ORAL | Status: AC
  Administered 2013-06-02: 300 mg via ORAL
  Filled 2013-06-02: qty 2

## 2013-06-02 MED ORDER — CLINDAMYCIN HCL 150 MG PO CAPS: 300.0000 mg | ORAL_CAPSULE | Freq: Three times a day (TID) | ORAL | Status: DC

## 2013-06-02 MED ORDER — IOHEXOL 300 MG/ML  SOLN
80.0000 mL | Freq: Once | INTRAMUSCULAR | Status: AC | PRN
  Administered 2013-06-02: 80 mL via INTRAVENOUS

## 2013-06-02 NOTE — ED Notes (Signed)
Called [employer].  They will send a cab over to pick up pt.

## 2013-06-02 NOTE — ED Provider Notes (Signed)
Pt received from [place], [place].  Pt presented to ED w/ right periorbital edema that was painful at onset. CT orbits sig for preseptal cellulitis of right eye.  Discussed w/ pt via phone interpreter.  He received his first dose of clindamycin and d/c'd home w/ same.  I advised him to f/u with his ophthalmologist on Monday and return to ER if his pain/edema worsens in meantime.      Otilio Miu, PA-C 06/02/13 0225

## 2013-06-03 NOTE — ED Provider Notes (Signed)
Medical screening examination/treatment/procedure(s) were performed by non-physician practitioner and as supervising physician I was immediately available for consultation/collaboration.  Flint Melter, MD 06/03/13 [phone]

## 2013-06-03 NOTE — ED Provider Notes (Signed)
Medical screening examination/treatment/procedure(s) were performed by non-physician practitioner and as supervising physician I was immediately available for consultation/collaboration.  Flint Melter, MD 06/03/13 [phone]

## 2014-10-16 ENCOUNTER — Ambulatory Visit: Payer: Self-pay | Attending: Internal Medicine

## 2014-11-12 ENCOUNTER — Encounter (HOSPITAL_COMMUNITY): Payer: Self-pay | Admitting: *Deleted

## 2014-11-12 ENCOUNTER — Emergency Department (HOSPITAL_COMMUNITY): Payer: Self-pay

## 2014-11-12 ENCOUNTER — Emergency Department (HOSPITAL_COMMUNITY)
Admission: EM | Admit: 2014-11-12 | Discharge: 2014-11-12 | Disposition: A | Discharge status: Home / Self Care | Payer: Self-pay | Attending: Emergency Medicine | Admitting: Emergency Medicine

## 2014-11-12 DIAGNOSIS — R609 Edema, unspecified: Secondary | ICD-10-CM

## 2014-11-12 DIAGNOSIS — R0602 Shortness of breath: Secondary | ICD-10-CM

## 2014-11-12 DIAGNOSIS — Z792 Long term (current) use of antibiotics: Secondary | ICD-10-CM | POA: Insufficient documentation

## 2014-11-12 DIAGNOSIS — M25572 Pain in left ankle and joints of left foot: Secondary | ICD-10-CM | POA: Insufficient documentation

## 2014-11-12 DIAGNOSIS — R1013 Epigastric pain: Secondary | ICD-10-CM | POA: Insufficient documentation

## 2014-11-12 DIAGNOSIS — R52 Pain, unspecified: Secondary | ICD-10-CM

## 2014-11-12 DIAGNOSIS — M79662 Pain in left lower leg: Secondary | ICD-10-CM | POA: Insufficient documentation

## 2014-11-12 DIAGNOSIS — R600 Localized edema: Secondary | ICD-10-CM | POA: Insufficient documentation

## 2014-11-12 DIAGNOSIS — I10 Essential (primary) hypertension: Secondary | ICD-10-CM | POA: Insufficient documentation

## 2014-11-12 LAB — CBC WITH DIFFERENTIAL/PLATELET
BASOS ABS: 0 10*3/uL (ref 0.0–0.1)
BASOS PCT: 0 % (ref 0–1)
EOS ABS: 0.1 10*3/uL (ref 0.0–0.7)
EOS PCT: 2 % (ref 0–5)
HEMATOCRIT: 41.7 % (ref 39.0–52.0)
Hemoglobin: 13.8 g/dL (ref 13.0–17.0)
LYMPHS PCT: 36 % (ref 12–46)
Lymphs Abs: 2 10*3/uL (ref 0.7–4.0)
MCH: 26.2 pg (ref 26.0–34.0)
MCHC: 33.1 g/dL (ref 30.0–36.0)
MCV: 79.1 fL (ref 78.0–100.0)
MONO ABS: 0.5 10*3/uL (ref 0.1–1.0)
Monocytes Relative: 10 % (ref 3–12)
Neutro Abs: 2.8 10*3/uL (ref 1.7–7.7)
Neutrophils Relative %: 52 % (ref 43–77)
PLATELETS: 199 10*3/uL (ref 150–400)
RBC: 5.27 MIL/uL (ref 4.22–5.81)
RDW: 14 % (ref 11.5–15.5)
WBC: 5.4 10*3/uL (ref 4.0–10.5)

## 2014-11-12 LAB — PRO B NATRIURETIC PEPTIDE: PRO B NATRI PEPTIDE: 16.2 pg/mL (ref 0–125)

## 2014-11-12 LAB — COMPREHENSIVE METABOLIC PANEL
ALT: 17 U/L (ref 0–53)
AST: 17 U/L (ref 0–37)
Albumin: 3.3 g/dL — ABNORMAL LOW (ref 3.5–5.2)
Alkaline Phosphatase: 47 U/L (ref 39–117)
Anion gap: 12 (ref 5–15)
BUN: 20 mg/dL (ref 6–23)
CALCIUM: 9.1 mg/dL (ref 8.4–10.5)
CO2: 23 meq/L (ref 19–32)
CREATININE: 1.02 mg/dL (ref 0.50–1.35)
Chloride: 106 mEq/L (ref 96–112)
GFR calc Af Amer: 89 mL/min — ABNORMAL LOW (ref >90)
GFR, EST NON AFRICAN AMERICAN: 77 mL/min — AB (ref >90)
Glucose, Bld: 104 mg/dL — ABNORMAL HIGH (ref 70–99)
Potassium: 4.4 mEq/L (ref 3.7–5.3)
SODIUM: 141 meq/L (ref 137–147)
TOTAL PROTEIN: 7 g/dL (ref 6.0–8.3)
Total Bilirubin: 0.2 mg/dL — ABNORMAL LOW (ref 0.3–1.2)

## 2014-11-12 LAB — LIPASE, BLOOD: LIPASE: 37 U/L (ref 11–59)

## 2014-11-12 MED ORDER — HYDROCODONE-ACETAMINOPHEN 5-325 MG PO TABS: 1.0000 | ORAL_TABLET | ORAL | Status: DC | PRN

## 2014-11-12 MED ORDER — HYDROCODONE-ACETAMINOPHEN 5-325 MG PO TABS
1.0000 | ORAL_TABLET | Freq: Once | ORAL | Status: AC
  Administered 2014-11-12: 1 via ORAL
  Filled 2014-11-12: qty 1

## 2014-11-12 NOTE — ED Notes (Signed)
Pt monitored by pulse ox, bp cuff, and 12-lead. 

## 2014-11-12 NOTE — Progress Notes (Signed)
Left lower extremity venous duplex completed.  Left:  No evidence of DVT, superficial thrombosis, or Baker's cyst.  Right:  Negative for DVT in the common femoral vein.  

## 2014-11-12 NOTE — ED Notes (Signed)
Patient transported to X-ray 

## 2014-11-12 NOTE — ED Notes (Signed)
Pt in c/o left lower leg swelling for two days with pain, CMS intact, swelling noted, reports tingling from area above posterior ankle into back of foot

## 2014-11-12 NOTE — Discharge Instructions (Signed)
Read the information below.  Use the prescribed medication as directed.  Please discuss all new medications with your pharmacist.  Do not take additional tylenol while taking the prescribed pain medication to avoid overdose.  You may return to the Emergency Department at any time for worsening condition or any new symptoms that concern you.  If you develop uncontrolled pain, weakness or numbness of the extremity, severe discoloration of the skin, or you are unable to walk or move your ankle, return to the ER for a recheck.      Ankle Pain Ankle pain is a common symptom. The bones, cartilage, tendons, and muscles of the ankle joint perform a lot of work each day. The ankle joint holds your body weight and allows you to move around. Ankle pain can occur on either side or back of 1 or both ankles. Ankle pain may be sharp and burning or dull and aching. There may be tenderness, stiffness, redness, or warmth around the ankle. The pain occurs more often when a person walks or puts pressure on the ankle. CAUSES  There are many reasons ankle pain can develop. It is important to work with your caregiver to identify the cause since many conditions can impact the bones, cartilage, muscles, and tendons. Causes for ankle pain include:  Injury, including a break (fracture), sprain, or strain often due to a fall, sports, or a high-impact activity.  Swelling (inflammation) of a tendon (tendonitis).  Achilles tendon rupture.  Ankle instability after repeated sprains and strains.  Poor foot alignment.  Pressure on a nerve (tarsal tunnel syndrome).  Arthritis in the ankle or the lining of the ankle.  Crystal formation in the ankle (gout or pseudogout). DIAGNOSIS  A diagnosis is based on your medical history, your symptoms, results of your physical exam, and results of diagnostic tests. Diagnostic tests may include X-ray exams or a computerized magnetic scan (magnetic resonance imaging, MRI). TREATMENT    Treatment will depend on the cause of your ankle pain and may include:  Keeping pressure off the ankle and limiting activities.  Using crutches or other walking support (a cane or brace).  Using rest, ice, compression, and elevation.  Participating in physical therapy or home exercises.  Wearing shoe inserts or special shoes.  Losing weight.  Taking medications to reduce pain or swelling or receiving an injection.  Undergoing surgery. HOME CARE INSTRUCTIONS   Only take over-the-counter or prescription medicines for pain, discomfort, or fever as directed by your caregiver.  Put ice on the injured area.  Put ice in a plastic bag.  Place a towel between your skin and the bag.  Leave the ice on for 15-20 minutes at a time, 03-04 times a day.  Keep your leg raised (elevated) when possible to lessen swelling.  Avoid activities that cause ankle pain.  Follow specific exercises as directed by your caregiver.  Record how often you have ankle pain, the location of the pain, and what it feels like. This information may be helpful to you and your caregiver.  Ask your caregiver about returning to work or sports and whether you should drive.  Follow up with your caregiver for further examination, therapy, or testing as directed. SEEK MEDICAL CARE IF:   Pain or swelling continues or worsens beyond 1 week.  You have an oral temperature above 102 F (38.9 C).  You are feeling unwell or have chills.  You are having an increasingly difficult time with walking.  You have loss of sensation  or other new symptoms.  You have questions or concerns. MAKE SURE YOU:   Understand these instructions.  Will watch your condition.  Will get help right away if you are not doing well or get worse. Document Released: 05/20/2010 Document Revised: 02/22/2012 Document Reviewed: 05/20/2010 [facility] Patient Information 2015 [place], [place]. This information is not intended to replace advice  given to you by your health care provider. Make sure you discuss any questions you have with your health care provider.    Emergency Department Resource Guide 1) Find a Doctor and Pay Out of Pocket Although you won't have to find out who is covered by your insurance plan, it is a good idea to ask around and get recommendations. You will then need to call the office and see if the doctor you have chosen will accept you as a new patient and what types of options they offer for patients who are self-pay. Some doctors offer discounts or will set up payment plans for their patients who do not have insurance, but you will need to ask so you aren't surprised when you get to your appointment.  2) Contact Your Local Health Department Not all health departments have doctors that can see patients for sick visits, but many do, so it is worth a call to see if yours does. If you don't know where your local health department is, you can check in your phone book. The CDC also has a tool to help you locate your state's health department, and many state websites also have listings of all of their local health departments.  3) Find a Walk-in Clinic If your illness is not likely to be very severe or complicated, you may want to try a walk in clinic. These are popping up all over the country in pharmacies, drugstores, and shopping centers. They're usually staffed by nurse practitioners or physician assistants that have been trained to treat common illnesses and complaints. They're usually fairly quick and inexpensive. However, if you have serious medical issues or chronic medical problems, these are probably not your best option.  No Primary Care Doctor: - Call Health Connect at  [phone] - they can help you locate a primary care doctor that  accepts your insurance, provides certain services, etc. - Physician Referral Service- [phone]  Chronic Pain Problems: Organization         Address  Phone   Notes  [facility]  [phone] Patients need to be referred by their primary care doctor.   Medication Assistance: Organization         Address  Phone   Notes  [facility] [address] [place], [place] [address] [phone] --Must be a resident of [place] [facility] [place] -- Must have NO insurance coverage whatsoever (no Medicaid/ Medicare, etc.) -- The pt. MUST have a primary care doctor that directs their care regularly and follows them in the community   MedAssist  [phone]   [employer]  [phone]    Agencies that provide inexpensive medical care: Organization         Address  Phone   Notes  [facility]  [phone]   [facility]    [phone]   [facility] [address] [place], [place] [address] [phone]   [facility] [address], [place] [phone]   [facility]anned Parenthood    [phone]  [facility]    [phone]   [facility]  [address], [place] Phone:  [phone], Fax:  [phone] Hours of Operation:  9 am - 6 pm, M-F.  Also accepts Medicaid/Medicare and self-pay.  [facility]  [address], [place] Phone: [phone], Fax: [phone]. Hours of Operation:  8:30 am - 5:30 pm, M-F.  Also accepts Medicaid and self-pay.  [facility] [place] [address] [place] Phone: [phone]   [facility] [address] Natasha Bence [place], [place] [phone], Ext. [phone] Mondays & Thursdays: 7-9 AM.  First 15 patients are seen on a first come, first serve basis.    Medicaid-accepting [facility] Providers:  Organization         Address  Phone   Notes  [facility] [address], [place] [phone] Also accepts self-pay patients.  [facility] [address] Laurell Josephs [place], [place]  [phone]   [facility] [address], [place] [phone]   [facility] [address], [place] [phone]   Renaye Rakers [address], [place]   [phone] Only accepts [place] Access [place] patients after they have their name applied to their card.   Self-Pay (no insurance) in [facility]:  Organization         Address  Phone   Notes  Sickle Cell Patients, [facility] [address] [place], [place] [phone]   [facility] [address] [place], [place] [phone]   [facility] [place]  [address], [place] [phone]   [facility]/Dr. Osei-Bonsu  [address] [place] or [address], [place] [phone] Phone number for both [place] and [place] locations is the same.  [facility] [address] [place] [phone]   [facility] [address], [place] or [address] [phone]   [facility] [address], [place] [phone], phone; [phone], fax Sees patients 1st and 3rd Saturday of every month.  Must not qualify for public or private insurance (i.e. Medicaid, Medicare, [place] Health Choice, Veterans' Benefits)  Household income should be no more than 200% of the poverty level The clinic cannot treat you if you are pregnant or think you are pregnant  Sexually transmitted diseases are not treated at the clinic.    Dental Care: Organization         Address  Phone  Notes  [facility] Department of [facility] [address] [place], [place] [phone] Accepts children up to age 40 who are enrolled in [place] or [place] Health Choice; pregnant women with a Medicaid card; and children who have applied for  Medicaid or [place] Health Choice, but were declined, whose parents can pay a reduced fee at time of service.  [facility] Department of [facility]  [address], [place] [phone] Accepts children up to age 97 who are enrolled in [place] or [place] Health Choice; pregnant women with a Medicaid card; and children who have applied for Medicaid or [place] Health Choice, but were declined, whose parents can pay a reduced fee at time  of service.  [facility]  [address], [place] [phone] Patients are seen by appointment only. Walk-ins are not accepted. [facility] will see patients 62 years of age and older. Monday - Tuesday (8am-5pm) Most Wednesdays (8:30-5pm) $30 per visit, cash only  [facility]  [address], [place] [facility] [phone] Patients are seen by appointment only. Walk-ins are not accepted. [facility] will see patients 62 years of age and older. One Wednesday Evening (Monthly: Volunteer Based).  $30 per visit, cash only  [facility]  [phone] for adults; Children under age 874, call Graduate Pediatric Dentistry at [phone]. Children aged 674-14, please call [phone] to request a pediatric application.  Dental services are provided in all areas of dental care including fillings, crowns and bridges, complete and partial dentures, implants, gum treatment, root canals, and extractions. Preventive care is also provided. Treatment is provided to both adults and children. Patients are selected via a lottery and there is often a waiting list.   [facility] [address], [place]  [phone] [email or web address]   [facility] [address], [place], [place] [phone], Ext. [phone] Second and Fourth Thursday of each month, opens at 6:30 AM; Clinic ends at 9 AM.  Patients are seen on a first-come first-served basis, and a limited number  are seen during each clinic.   [facility]  [address], [place], [place] [phone]   Eligibility Requirements You must have lived in [place], [place], or [place] counties for at least the last three months.   You cannot be eligible for state or federal sponsored National Cityhealthcare insurance, including CIGNAVeterans Administration, [place]icaid, or [employer] EntertainmentMedicare.   You generally cannot be eligible for healthcare insurance through your employer.    How to apply: Eligibility screenings are held every Tuesday and Wednesday afternoon from 1:00 pm until 4:00 pm. You do not need an appointment for the interview!  [facility] [address], [place], [place] [phone]   [facility]  [phone]   [facility]  [phone]   [facility]  [phone]    Behavioral Health Resources in the Community: Intensive Outpatient Programs Organization         Address  Phone  Notes  [facility] [address], [place], [place] [phone]   [facility] Outpatient [address], [place], [place] [phone]   [facility] [address], [place], [place]  [phone]   [facility] [address],  [place], [place] [phone] or [phone]   Substance Abuse Resources Organization         Address  Phone  Notes  [facility]  [phone]   [facility]  [phone]   [facility]  [phone]   [facility]  [phone]   Residential & Outpatient Substance Abuse Program  [phone]   Psychological Services Organization         Address  Phone  Notes  [facility]  [phone]- [phone]   [facility]  [phone]- [phone]   [facility] [address], [place] [phone] or [phone]    Mobile Crisis Teams Organization         Address  Phone  Notes  Therapeutic  Alternatives, Mobile Crisis Care Unit  [phone]   Assertive Psychotherapeutic  Services  [address] [place], [place] [phone]   [facility] [address] [place] [phone]    Self-Help/Support Groups Organization         Address  Phone             Notes  [facility] [place] - variety of support groups  336- I7437963 Call for more information  Narcotics Anonymous (NA), [facility]aring [facility] [address], [employer] [place]  2 meetings at this location   Statistician         Address  Phone  Notes  [facility] [address],    [place]  [phone]   [facility]  [address], [place] [address], [place], [place] [phone]   [facility] [address] [place], [place] [phone] Admissions: 8am-3pm M-F  [facility] [address],    [place], [place] [phone]   [facility] [address] [place], [place], [place] [phone]   [facility] [address],  [place], [place] [phone]   [facility] - Intensive Outpatient [address] Laurell [address]s [address], [place], [place] [phone]   [facility] ([facility] [address] [place].,  [place], [place] [phone] or [phone]   [facility] ([facility]) [address], [place], [place] [phone] Accepts Medicaid  [facility] [address]  [place] [phone] Substance Abuse/Addiction Treatment   [facility] Organization         Address  Phone  Notes  [facility]  [phone]   Angie Fava, PhD [address] Ervin Knack [place], [place]   [phone] or [phone]   [facility]   [address] [place], [place] [phone]   [facility] [address], [place], [place] [phone] Insurance/Medicaid/sponsorship through [facility]  [address]                                    [place], [place] [phone] Therapy/tele-psych/case  [facility] [address] Street[address]ern[address] [place], [place] [phone]    Dr. Lolly Mustache  [phone]   [facility]  [employer] [facility] Dept. 1) [address], [place] 2) [address] [place] 3)  [address], [place] [phone]  [phone]   [facility] Child Abuse Hotline 801-198-5877 or [phone] (After Hours)
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
## 2014-12-04 ENCOUNTER — Emergency Department (HOSPITAL_COMMUNITY)
Admission: EM | Admit: 2014-12-04 | Discharge: 2014-12-04 | Disposition: A | Discharge status: Home / Self Care | Payer: Self-pay | Attending: Emergency Medicine | Admitting: Emergency Medicine

## 2014-12-04 ENCOUNTER — Encounter (HOSPITAL_COMMUNITY): Payer: Self-pay | Admitting: Emergency Medicine

## 2014-12-04 ENCOUNTER — Emergency Department (HOSPITAL_COMMUNITY): Payer: Self-pay

## 2014-12-04 DIAGNOSIS — Z79899 Other long term (current) drug therapy: Secondary | ICD-10-CM | POA: Insufficient documentation

## 2014-12-04 DIAGNOSIS — H538 Other visual disturbances: Secondary | ICD-10-CM | POA: Insufficient documentation

## 2014-12-04 DIAGNOSIS — M542 Cervicalgia: Secondary | ICD-10-CM | POA: Insufficient documentation

## 2014-12-04 DIAGNOSIS — M1990 Unspecified osteoarthritis, unspecified site: Secondary | ICD-10-CM | POA: Insufficient documentation

## 2014-12-04 DIAGNOSIS — R0602 Shortness of breath: Secondary | ICD-10-CM | POA: Insufficient documentation

## 2014-12-04 DIAGNOSIS — M549 Dorsalgia, unspecified: Secondary | ICD-10-CM | POA: Insufficient documentation

## 2014-12-04 DIAGNOSIS — R079 Chest pain, unspecified: Secondary | ICD-10-CM | POA: Insufficient documentation

## 2014-12-04 DIAGNOSIS — R509 Fever, unspecified: Secondary | ICD-10-CM | POA: Insufficient documentation

## 2014-12-04 DIAGNOSIS — I10 Essential (primary) hypertension: Secondary | ICD-10-CM | POA: Insufficient documentation

## 2014-12-04 DIAGNOSIS — K2990 Gastroduodenitis, unspecified, without bleeding: Secondary | ICD-10-CM

## 2014-12-04 LAB — CBC WITH DIFFERENTIAL/PLATELET
BASOS ABS: 0 10*3/uL (ref 0.0–0.1)
BASOS PCT: 0 % (ref 0–1)
Eosinophils Absolute: 0.1 10*3/uL (ref 0.0–0.7)
Eosinophils Relative: 2 % (ref 0–5)
HCT: 44.7 % (ref 39.0–52.0)
Hemoglobin: 15 g/dL (ref 13.0–17.0)
Lymphocytes Relative: 20 % (ref 12–46)
Lymphs Abs: 0.9 10*3/uL (ref 0.7–4.0)
MCH: 26.5 pg (ref 26.0–34.0)
MCHC: 33.6 g/dL (ref 30.0–36.0)
MCV: 79 fL (ref 78.0–100.0)
Monocytes Absolute: 0.9 10*3/uL (ref 0.1–1.0)
Monocytes Relative: 19 % — ABNORMAL HIGH (ref 3–12)
NEUTROS ABS: 2.7 10*3/uL (ref 1.7–7.7)
NEUTROS PCT: 59 % (ref 43–77)
Platelets: 165 10*3/uL (ref 150–400)
RBC: 5.66 MIL/uL (ref 4.22–5.81)
RDW: 14.1 % (ref 11.5–15.5)
WBC: 4.6 10*3/uL (ref 4.0–10.5)

## 2014-12-04 LAB — URINALYSIS, ROUTINE W REFLEX MICROSCOPIC
Bilirubin Urine: NEGATIVE
Glucose, UA: NEGATIVE mg/dL
Hgb urine dipstick: NEGATIVE
Ketones, ur: NEGATIVE mg/dL
LEUKOCYTES UA: NEGATIVE
NITRITE: NEGATIVE
PROTEIN: NEGATIVE mg/dL
Specific Gravity, Urine: 1.013 (ref 1.005–1.030)
UROBILINOGEN UA: 1 mg/dL (ref 0.0–1.0)
pH: 7 (ref 5.0–8.0)

## 2014-12-04 LAB — BASIC METABOLIC PANEL
ANION GAP: 7 (ref 5–15)
BUN: 9 mg/dL (ref 6–23)
CALCIUM: 9.3 mg/dL (ref 8.4–10.5)
CHLORIDE: 104 meq/L (ref 96–112)
CO2: 27 mmol/L (ref 19–32)
CREATININE: 0.94 mg/dL (ref 0.50–1.35)
GFR calc non Af Amer: 88 mL/min — ABNORMAL LOW (ref >90)
Glucose, Bld: 132 mg/dL — ABNORMAL HIGH (ref 70–99)
Potassium: 4.1 mmol/L (ref 3.5–5.1)
SODIUM: 138 mmol/L (ref 135–145)

## 2014-12-04 LAB — LIPASE, BLOOD: LIPASE: 30 U/L (ref 11–59)

## 2014-12-04 LAB — I-STAT CG4 LACTIC ACID, ED: Lactic Acid, Venous: 1.04 mmol/L (ref 0.5–2.2)

## 2014-12-04 MED ORDER — ONDANSETRON HCL 4 MG/2ML IJ SOLN
4.0000 mg | Freq: Once | INTRAMUSCULAR | Status: AC
  Administered 2014-12-04: 4 mg via INTRAVENOUS
  Filled 2014-12-04: qty 2

## 2014-12-04 MED ORDER — HYDROMORPHONE HCL 1 MG/ML IJ SOLN
1.0000 mg | Freq: Once | INTRAMUSCULAR | Status: AC
  Administered 2014-12-04: 1 mg via INTRAVENOUS
  Filled 2014-12-04: qty 1

## 2014-12-04 MED ORDER — FAMOTIDINE 20 MG PO TABS: 20.0000 mg | ORAL_TABLET | Freq: Two times a day (BID) | ORAL | Status: AC

## 2014-12-04 MED ORDER — SODIUM CHLORIDE 0.9 % IV BOLUS (SEPSIS)
1000.0000 mL | Freq: Once | INTRAVENOUS | Status: AC
  Administered 2014-12-04: 1000 mL via INTRAVENOUS

## 2014-12-04 MED ORDER — IOHEXOL 300 MG/ML  SOLN
25.0000 mL | INTRAMUSCULAR | Status: AC
  Administered 2014-12-04: 25 mL via ORAL

## 2014-12-04 MED ORDER — HYDROCODONE-ACETAMINOPHEN 5-325 MG PO TABS: 1.0000 | ORAL_TABLET | Freq: Four times a day (QID) | ORAL | Status: DC | PRN

## 2014-12-04 MED ORDER — SUCRALFATE 1 G PO TABS: 1.0000 g | ORAL_TABLET | Freq: Three times a day (TID) | ORAL | Status: DC

## 2014-12-04 MED ORDER — IOHEXOL 300 MG/ML  SOLN
100.0000 mL | Freq: Once | INTRAMUSCULAR | Status: AC | PRN
  Administered 2014-12-04: 100 mL via INTRAVENOUS

## 2014-12-04 MED ORDER — MORPHINE SULFATE 4 MG/ML IJ SOLN
4.0000 mg | Freq: Once | INTRAMUSCULAR | Status: AC
  Administered 2014-12-04: 4 mg via INTRAVENOUS
  Filled 2014-12-04: qty 1

## 2014-12-04 MED ORDER — ONDANSETRON 4 MG PO TBDP
4.0000 mg | ORAL_TABLET | Freq: Once | ORAL | Status: AC
  Administered 2014-12-04: 4 mg via ORAL
  Filled 2014-12-04: qty 1

## 2014-12-04 NOTE — ED Notes (Signed)
Pt had episode of emesis. Dr. Rosalia Hammersay aware.

## 2014-12-04 NOTE — ED Notes (Signed)
Translator/friend stated. He's had a stomach ache since yesterday. He also feels like he has a fever.

## 2014-12-04 NOTE — Discharge Instructions (Signed)
Return here as needed.  Follow up with the Dr. provided and the resources provided by the case manager

## 2014-12-04 NOTE — ED Provider Notes (Signed)
CSN: [id number]     Arrival date & time 12/04/14  1138 History   First MD Initiated Contact with Patient 12/04/14 1227     Chief Complaint  Patient presents with  . Abdominal Pain  . Fever     (Consider location/radiation/quality/duration/timing/severity/associated sxs/prior Treatment) HPI Patient presents to the emergency department with abdominal discomfort since yesterday.  The patient states that it mainly upper abdominal pain.  The patient states that he has had some nausea but no vomiting.  The patient has chest pain, shortness of breath, headache, blurred vision, weakness, dizziness, back pain, neck pain, dysuria, incontinence, diarrhea, lightheadedness, rash, near syncope or syncope.  The patient states that he did not take any medications prior to arrival.  Patient states that the thing seems make his condition, better or worse Past Medical History  Diagnosis Date  . Hypertension   . Arthritis    History reviewed. No pertinent past surgical history. No family history on file. History  Substance Use Topics  . Smoking status: Never Smoker   . Smokeless tobacco: Not on file  . Alcohol Use: No    Review of Systems  All other systems negative except as documented in the HPI. All pertinent positives and negatives as reviewed in the HPI.  Allergies  Nsaids and Aspirin  Home Medications   Prior to Admission medications   Medication Sig Start Date End Date Taking? Authorizing Provider  Acetaminophen (NON-ASPIRIN EXTRA STRENGTH) 500 MG TBDP Take 1 tablet by mouth daily as needed (FOR FEVER).    Yes Historical Provider, MD  HYDROcodone-acetaminophen (NORCO/VICODIN) 5-325 MG per tablet Take 1 tablet by mouth every 4 (four) hours as needed for moderate pain or severe pain. 11/12/14  Yes Trixie DredgeEmily West, PA-C  ranitidine (ZANTAC) 150 MG tablet Take 150 mg by mouth 2 (two) times daily.   Yes Historical Provider, MD   BP 115/72 mmHg  Pulse 63  Temp(Src) 98.7 F (37.1 C) (Oral)   Resp 19  Wt 168 lb (76.204 kg)  SpO2 97% Physical Exam  Constitutional: He is oriented to person, place, and time. He appears well-developed and well-nourished. No distress.  HENT:  Head: Normocephalic and atraumatic.  Mouth/Throat: Oropharynx is clear and moist.  Eyes: Pupils are equal, round, and reactive to light.  Neck: Normal range of motion. Neck supple.  Cardiovascular: Normal rate, regular rhythm and normal heart sounds.  Exam reveals no gallop and no friction rub.   No murmur heard. Pulmonary/Chest: Effort normal and breath sounds normal. No respiratory distress. He has no wheezes.  Abdominal: Soft. Normal appearance and bowel sounds are normal. He exhibits no distension. There is no hepatosplenomegaly. There is tenderness. There is no rebound, no guarding and no CVA tenderness. No hernia.    Neurological: He is alert and oriented to person, place, and time. He exhibits normal muscle tone. Coordination normal.  Skin: Skin is warm and dry. No rash noted. No erythema.  Nursing note and vitals reviewed.   ED Course  Procedures (including critical care time) Labs Review Labs Reviewed  BASIC METABOLIC PANEL - Abnormal; Notable for the following:    Glucose, Bld 132 (*)    GFR calc non Af Amer 88 (*)    All other components within normal limits  CBC WITH DIFFERENTIAL - Abnormal; Notable for the following:    Monocytes Relative 19 (*)    All other components within normal limits  LIPASE, BLOOD  URINALYSIS, ROUTINE W REFLEX MICROSCOPIC  I-STAT CG4 LACTIC ACID, ED  I-STAT CG4 LACTIC ACID, ED    Imaging Review Ct Abdomen Pelvis W Contrast  12/04/2014   CLINICAL DATA:  Fever and abdominal pain  EXAM: CT ABDOMEN AND PELVIS WITH CONTRAST  TECHNIQUE: Multidetector CT imaging of the abdomen and pelvis was performed using the standard protocol following bolus administration of intravenous contrast. Oral contrast was also administered.  CONTRAST:  100mL OMNIPAQUE IOHEXOL 300 MG/ML   SOLN  COMPARISON:  August 06, 2008  FINDINGS: There is mild scarring in the lung bases.  There are several subcentimeter areas of decreased attenuation in the liver which most likely represent either small cysts or hamartomas. A previously noted presumed cyst in the posterior segment right lobe of the liver is smaller at this time compared to the previous study. No new liver lesions are identified. Gallbladder wall is not appreciably thickened. There is no biliary duct dilatation.  Spleen, pancreas, and adrenals appear normal. There is a cyst in the posterior aspect of the mid right kidney measuring 2.8 x 2.0 cm. There is a cyst in the lateral upper pole of the left kidney measuring 3.5 x 3.5 cm. There is a cyst in the anterior mid to lower pole left kidney measuring 3.1 x 3.4 cm. No noncystic masses are identified in either kidney. There is no hydronephrosis on either side. There is no renal or ureteral calculus on either side.  In the pelvis, the urinary bladder is midline with normal wall thickness. There is no pelvic mass or fluid collection. Appendix appears normal.  There is wall thickening in the gastric antrum and proximal duodenum consistent with distal gastritis and proximal duodenitis. No fistula or ulceration is seen in this area on CT. There is also some mild wall thickening and the distal descending colon and proximal sigmoid colon without diverticular change. These areas are felt to represent mild nonspecific colitis. There is no surrounding mesenteric thickening.  No bowel obstruction.  No free air or portal venous air.  There is no appreciable ascites, adenopathy, or abscess in the abdomen or pelvis. There is no demonstrable abdominal aortic aneurysm. There is degenerative change in the lumbar spine. There are no blastic or lytic bone lesions.  IMPRESSION: Evidence of distal gastritis and proximal duodenitis. Suspect a degree of distal descending colon and proximal sigmoid colon colitis,  nonspecific. There is no evidence suggesting diverticulitis. Note that these changes in the colon or mild without appreciable mesenteric thickening.  No abscess.  Appendix appears normal.  No bowel obstruction.  Renal cysts bilaterally. Small liver lesions, most likely representing either small cysts or hamartomas.   Electronically Signed   By: Bretta BangWilliam  Woodruff M.D.   On: 12/04/2014 15:04    Patient does not have any laboratory abnormalities.  He does have some evidence of gastritis and proximal duodenitis.  Patient be referred to GI.  Patient is also given resources by the case manager, [place] be advised to increase his fluid intake and rest as much as possible MDM   Final diagnoses:  None       Carlyle DollyChristopher W Jackolyn Geron, PA-C 12/04/14 1545  Hilario Quarryanielle S Ray, MD 12/10/14 1130

## 2014-12-04 NOTE — ED Notes (Signed)
Patient transported to CT 

## 2014-12-04 NOTE — ED Notes (Signed)
Patient is unable to give a urine specimen at this time  

## 2014-12-04 NOTE — ED Notes (Signed)
CT informed pt done with contrast  

## 2014-12-06 ENCOUNTER — Encounter (HOSPITAL_COMMUNITY): Payer: Self-pay | Admitting: Emergency Medicine

## 2014-12-06 ENCOUNTER — Emergency Department (HOSPITAL_COMMUNITY): Payer: Self-pay

## 2014-12-06 ENCOUNTER — Emergency Department (HOSPITAL_COMMUNITY)
Admission: EM | Admit: 2014-12-06 | Discharge: 2014-12-07 | Disposition: A | Discharge status: Home / Self Care | Payer: Self-pay | Attending: Emergency Medicine | Admitting: Emergency Medicine

## 2014-12-06 DIAGNOSIS — M1990 Unspecified osteoarthritis, unspecified site: Secondary | ICD-10-CM | POA: Insufficient documentation

## 2014-12-06 DIAGNOSIS — K226 Gastro-esophageal laceration-hemorrhage syndrome: Secondary | ICD-10-CM | POA: Insufficient documentation

## 2014-12-06 DIAGNOSIS — Z79899 Other long term (current) drug therapy: Secondary | ICD-10-CM | POA: Insufficient documentation

## 2014-12-06 DIAGNOSIS — R112 Nausea with vomiting, unspecified: Secondary | ICD-10-CM

## 2014-12-06 DIAGNOSIS — R109 Unspecified abdominal pain: Secondary | ICD-10-CM

## 2014-12-06 DIAGNOSIS — R1013 Epigastric pain: Secondary | ICD-10-CM | POA: Insufficient documentation

## 2014-12-06 DIAGNOSIS — I10 Essential (primary) hypertension: Secondary | ICD-10-CM | POA: Insufficient documentation

## 2014-12-06 LAB — COMPREHENSIVE METABOLIC PANEL
ALBUMIN: 3.3 g/dL — AB (ref 3.5–5.2)
ALT: 19 U/L (ref 0–53)
ANION GAP: 8 (ref 5–15)
AST: 20 U/L (ref 0–37)
Alkaline Phosphatase: 45 U/L (ref 39–117)
BUN: 11 mg/dL (ref 6–23)
CALCIUM: 8.9 mg/dL (ref 8.4–10.5)
CO2: 25 mmol/L (ref 19–32)
CREATININE: 0.85 mg/dL (ref 0.50–1.35)
Chloride: 105 mEq/L (ref 96–112)
GFR calc non Af Amer: >90 mL/min (ref >90)
Glucose, Bld: 110 mg/dL — ABNORMAL HIGH (ref 70–99)
POTASSIUM: 3.7 mmol/L (ref 3.5–5.1)
Sodium: 138 mmol/L (ref 135–145)
TOTAL PROTEIN: 6.6 g/dL (ref 6.0–8.3)
Total Bilirubin: 0.6 mg/dL (ref 0.3–1.2)

## 2014-12-06 LAB — I-STAT CG4 LACTIC ACID, ED: Lactic Acid, Venous: 0.45 mmol/L — ABNORMAL LOW (ref 0.5–2.2)

## 2014-12-06 LAB — CBC WITH DIFFERENTIAL/PLATELET
BASOS PCT: 0 % (ref 0–1)
Basophils Absolute: 0 10*3/uL (ref 0.0–0.1)
Eosinophils Absolute: 0 10*3/uL (ref 0.0–0.7)
Eosinophils Relative: 1 % (ref 0–5)
HCT: 41.8 % (ref 39.0–52.0)
Hemoglobin: 14 g/dL (ref 13.0–17.0)
Lymphocytes Relative: 31 % (ref 12–46)
Lymphs Abs: 1.4 10*3/uL (ref 0.7–4.0)
MCH: 26.3 pg (ref 26.0–34.0)
MCHC: 33.5 g/dL (ref 30.0–36.0)
MCV: 78.4 fL (ref 78.0–100.0)
MONO ABS: 0.6 10*3/uL (ref 0.1–1.0)
Monocytes Relative: 14 % — ABNORMAL HIGH (ref 3–12)
NEUTROS PCT: 54 % (ref 43–77)
Neutro Abs: 2.4 10*3/uL (ref 1.7–7.7)
Platelets: 159 10*3/uL (ref 150–400)
RBC: 5.33 MIL/uL (ref 4.22–5.81)
RDW: 14.1 % (ref 11.5–15.5)
WBC: 4.5 10*3/uL (ref 4.0–10.5)

## 2014-12-06 LAB — LIPASE, BLOOD: LIPASE: 30 U/L (ref 11–59)

## 2014-12-06 MED ORDER — IOHEXOL 300 MG/ML  SOLN
25.0000 mL | Freq: Once | INTRAMUSCULAR | Status: AC | PRN
  Administered 2014-12-06: 25 mL via ORAL

## 2014-12-06 MED ORDER — GI COCKTAIL ~~LOC~~
30.0000 mL | Freq: Once | ORAL | Status: AC
  Administered 2014-12-06: 30 mL via ORAL
  Filled 2014-12-06: qty 30

## 2014-12-06 MED ORDER — ONDANSETRON 4 MG PO TBDP
4.0000 mg | ORAL_TABLET | Freq: Once | ORAL | Status: AC
Start: 2014-12-06 — End: 2014-12-06
  Administered 2014-12-06: 4 mg via ORAL
  Filled 2014-12-06: qty 1

## 2014-12-06 MED ORDER — IOHEXOL 300 MG/ML  SOLN
100.0000 mL | Freq: Once | INTRAMUSCULAR | Status: AC | PRN
  Administered 2014-12-06: 100 mL via INTRAVENOUS

## 2014-12-06 NOTE — ED Notes (Signed)
Pt seen here Tuesday for gastritis. Pt having continued nvd and dizziness.

## 2014-12-06 NOTE — ED Provider Notes (Signed)
CSN: [id number]     Arrival date & time 12/06/14  1919 History   First MD Initiated Contact with Patient 12/06/14 1940     Chief Complaint  Patient presents with  . Emesis     (Consider location/radiation/quality/duration/timing/severity/associated sxs/prior Treatment) Patient is a 63 y.o. male presenting with vomiting. The history is provided by the patient, medical records and a relative. The history is limited by a language barrier. A language interpreter was used (Patient's son).  Emesis Associated symptoms: abdominal pain   Associated symptoms: no diarrhea      Gary Quinn is a 63 y.o. male  with a hx of HTN, arthritis presents to the Emergency Department complaining of gradual, persistent, progressively worsening abdominal pain and distention onset 3 days ago. Patient is accompanied by his son. Patient's son reports that patient was seen 2 days ago for same but continue to feel poorly and after discharge. Patient's son reports the patient has continued to have pain and persistent vomiting since discharge in spite of medication usage.  Patient reports epigastric pain initially which now radiates throughout his belly. He denies headache, chest pain, shortness of breath, diarrhea, weakness, dizziness, syncope, dysuria, hematuria. Patient also denies penile or testicular pain or discharge. He denies urinary incontinence.  Patient and son deny fever however admit that they have not attempted to measure fever at home.  Patient reports emesis is nonbloody nonbilious. No coffee-ground emesis, no melena or hematochezia.  No alleviating factors. Patient reports that movement and palpation makes the pain worse.    Past Medical History  Diagnosis Date  . Hypertension   . Arthritis    History reviewed. No pertinent past surgical history. No family history on file. History  Substance Use Topics  . Smoking status: Never Smoker   . Smokeless tobacco: Not on file  . Alcohol Use: No    Review  of Systems  Constitutional: Negative for fever, diaphoresis, appetite change, fatigue and unexpected weight change.  HENT: Negative for mouth sores and trouble swallowing.   Respiratory: Negative for cough, chest tightness, shortness of breath, wheezing and stridor.   Cardiovascular: Negative for chest pain and palpitations.  Gastrointestinal: Positive for nausea, vomiting and abdominal pain. Negative for diarrhea, constipation, blood in stool, abdominal distention and rectal pain.  Genitourinary: Negative for dysuria, urgency, frequency, hematuria, flank pain and difficulty urinating.  Musculoskeletal: Negative for back pain, neck pain and neck stiffness.  Skin: Negative for rash.  Neurological: Negative for weakness.  Hematological: Negative for adenopathy.  Psychiatric/Behavioral: Negative for confusion.  All other systems reviewed and are negative.     Allergies  Nsaids and Aspirin  Home Medications   Prior to Admission medications   Medication Sig Start Date End Date Taking? Authorizing Provider  famotidine (PEPCID) 20 MG tablet Take 1 tablet (20 mg total) by mouth 2 (two) times daily. 12/04/14  Yes Jamesetta Orleans Lawyer, PA-C  HYDROcodone-acetaminophen (NORCO/VICODIN) 5-325 MG per tablet Take 1 tablet by mouth every 6 (six) hours as needed for moderate pain. 12/04/14  Yes Jamesetta Orleans Lawyer, PA-C  ranitidine (ZANTAC) 150 MG tablet Take 150 mg by mouth 2 (two) times daily.   Yes Historical Provider, MD  sucralfate (CARAFATE) 1 G tablet Take 1 tablet (1 g total) by mouth 4 (four) times daily -  with meals and at bedtime. 12/04/14  Yes Jamesetta Orleans Lawyer, PA-C  omeprazole (PRILOSEC) 20 MG capsule Take 1 capsule (20 mg total) by mouth daily. 12/07/14   Leonor Darnell, PA-C  ondansetron (  ZOFRAN ODT) 8 MG disintegrating tablet 8mg  ODT q4 hours prn nausea 12/07/14   Grayland Daisey, PA-C   BP 153/88 mmHg  Pulse 72  Temp(Src) 98.2 F (36.8 C) (Oral)  Resp 15  Ht 5\' 3"   (1.6 m)  SpO2 97% Physical Exam  Constitutional: He appears well-developed and well-nourished.  HENT:  Head: Normocephalic and atraumatic.  Mouth/Throat: Oropharynx is clear and moist.  Eyes: Conjunctivae are normal. No scleral icterus.  Cardiovascular: Regular rhythm, normal heart sounds and intact distal pulses.   Pulses:      Radial pulses are 2+ on the right side, and 2+ on the left side.       Dorsalis pedis pulses are 2+ on the right side, and 2+ on the left side.       Posterior tibial pulses are 2+ on the right side, and 2+ on the left side.  Tachycardia Capillary refill less than 3 seconds  Pulmonary/Chest: Effort normal and breath sounds normal. No respiratory distress.  Abdominal: Soft. Bowel sounds are normal. He exhibits no distension and no mass. There is generalized tenderness. There is guarding. There is no rebound and no CVA tenderness.  Abdomen generally tender with guarding throughout No CVA tenderness No rebound tenderness  Neurological: He is alert. He exhibits normal muscle tone. Coordination normal.  Skin: Skin is warm and dry.  Psychiatric: He has a normal mood and affect.  Nursing note and vitals reviewed.   ED Course  Procedures (including critical care time) Labs Review Labs Reviewed  CBC WITH DIFFERENTIAL - Abnormal; Notable for the following:    Monocytes Relative 14 (*)    All other components within normal limits  COMPREHENSIVE METABOLIC PANEL - Abnormal; Notable for the following:    Glucose, Bld 110 (*)    Albumin 3.3 (*)    All other components within normal limits  I-STAT CG4 LACTIC ACID, ED - Abnormal; Notable for the following:    Lactic Acid, Venous 0.45 (*)    All other components within normal limits  LIPASE, BLOOD    Imaging Review Ct Abdomen Pelvis W Contrast  12/06/2014   CLINICAL DATA:  Abdominal pain for 3 days, pain all over, seen here for gastritis 2 days ago, nausea, vomiting, diarrhea, history hypertension  EXAM: CT  ABDOMEN AND PELVIS WITH CONTRAST  TECHNIQUE: Multidetector CT imaging of the abdomen and pelvis was performed using the standard protocol following bolus administration of intravenous contrast. Sagittal and coronal MPR images reconstructed from axial data set.  CONTRAST:  100mL OMNIPAQUE IOHEXOL 300 MG/ML  SOLN  COMPARISON:  12/04/2014  FINDINGS: Mild dependent bibasilar atelectasis.  BILATERAL renal cysts unchanged.  Tiny low-attenuation foci in liver unchanged.  Remainder of liver, spleen, pancreas, kidneys, and adrenal glands normal.  Stomach incompletely distended suboptimal assessment of wall thickness.  Question of mild distal antral gastric wall thickening raised versus artifact from underdistention.  Duodenum grossly unremarkable.  No biliary dilatation.  Normal appendix.  Large and small bowel loops grossly unremarkable for technique.  Normal appearing bladder and ureters.  Scattered respiratory motion artifacts.  No mass, adenopathy, free fluid, or free air.  No acute osseous findings.  Multifactorial spinal stenosis at L4-L5 and question L3-L4.  IMPRESSION: Questionable mild distal gastric antral wall thickening.  No definite CT signs of duodenitis.  BILATERAL renal cysts.  Spinal stenosis at L4-L5 and questionably L3-L4.   Electronically Signed   By: Ulyses SouthwardMark  Boles M.D.   On: 12/06/2014 22:17     EKG Interpretation  None      MDM   Final diagnoses:  Abdominal pain  Epigastric abdominal pain  Non-intractable vomiting with nausea, vomiting of unspecified type  Mallory-Weiss tear   Gary Quinn presents with worsening abdominal pain, nausea and vomiting after evaluation in the emergency department 2 days ago. At that time patient with CT scan showing gastritis and mild colitis.  Patient appears ill with guarding on exam. Will obtain new CT scan to rule out abscess. We'll also obtain blood work.  7PM Patient with emesis here in the emergency department.  Son reports there were several small  streaks of blood noted after patient retched several times.  Abdomen remains tender in epigastrium, improved tenderness in the lower abdomen  9:36 PM Labs reassuring, CT pending. Patient's pain controlled at this time.  10:45PM CT largely unremarkable without evidence of colitis. No evidence of abscess or appendicitis, cholecystitis.  Patient dilated by Dr. Littie Deedsgentry. Lactic acid added.  12:24 AM Normal lactic acid. Patient also given GI cocktail with resolution of abdominal pain. Abdomen is soft without significant tenderness at this time. No guarding or rigidity. No peritoneal signs. Patient is tolerating by mouth without difficulty. Patient would be discharged home with Zofran and omeprazole. He may continue the other medications given to him previously for gastritis. He is to follow-up with GI in 3 days.  I have personally reviewed patient's vitals, nursing note and any pertinent labs or imaging.  I performed an undressed physical exam.    It has been determined that no acute conditions requiring further emergency intervention are present at this time. The patient/guardian have been advised of the diagnosis and plan. I reviewed all labs and imaging including any potential incidental findings. We have discussed signs and symptoms that warrant return to the ED and they are listed in the discharge instructions.    Vital signs are stable at discharge.   BP 153/88 mmHg  Pulse 72  Temp(Src) 98.2 F (36.8 C) (Oral)  Resp 15  Ht 5\' 3"  (1.6 m)  SpO2 97%   The patient was discussed with and seen by Dr. Littie DeedsGentry who agrees with the treatment plan.   Dahlia ClientHannah Aylen Rambert, PA-C 12/07/14 [id number]  Mirian MoMatthew Gentry, MD 12/10/14 [phone]

## 2014-12-07 MED ORDER — ONDANSETRON 8 MG PO TBDP
ORAL_TABLET | ORAL | Status: DC
Start: 2014-12-07 — End: 2015-04-08

## 2014-12-07 MED ORDER — PANTOPRAZOLE SODIUM 40 MG PO TBEC
40.0000 mg | DELAYED_RELEASE_TABLET | Freq: Once | ORAL | Status: AC
Start: 2014-12-07 — End: 2014-12-07
  Administered 2014-12-07: 40 mg via ORAL
  Filled 2014-12-07: qty 1

## 2014-12-07 MED ORDER — OMEPRAZOLE 20 MG PO CPDR: 20.0000 mg | DELAYED_RELEASE_CAPSULE | Freq: Every day | ORAL | Status: DC

## 2014-12-07 NOTE — ED Notes (Signed)
Po challenge started as ordered.

## 2014-12-07 NOTE — Discharge Instructions (Signed)
1. Medications: zofran, omeprazole, usual home medications 2. Treatment: rest, drink plenty of fluids, advance diet slowly, drink clear liquids including Sprite, water, broth or juice (avoid pineapple juice and orange juice) 3. Follow Up: Please followup with your primary doctor in 2 days for discussion of your diagnoses and further evaluation after today's visit; if you do not have a primary care doctor use the resource guide provided to find one; Please return to the ER for persistent vomiting, high fevers or worsening symptoms; follow-up with gastroenterology as directed

## 2014-12-07 NOTE — ED Notes (Signed)
Pt A&OX4, ambulatory at d/c with steady gait, NAD 

## 2015-03-19 ENCOUNTER — Emergency Department (HOSPITAL_COMMUNITY)
Admission: EM | Admit: 2015-03-19 | Discharge: 2015-03-19 | Disposition: A | Discharge status: Home / Self Care | Payer: Commercial Managed Care - PPO | Attending: Emergency Medicine | Admitting: Emergency Medicine

## 2015-03-19 ENCOUNTER — Emergency Department (HOSPITAL_COMMUNITY): Payer: Commercial Managed Care - PPO

## 2015-03-19 ENCOUNTER — Encounter (HOSPITAL_COMMUNITY): Payer: Self-pay | Admitting: Emergency Medicine

## 2015-03-19 DIAGNOSIS — K2970 Gastritis, unspecified, without bleeding: Secondary | ICD-10-CM

## 2015-03-19 DIAGNOSIS — R1013 Epigastric pain: Secondary | ICD-10-CM | POA: Diagnosis present

## 2015-03-19 DIAGNOSIS — Z79899 Other long term (current) drug therapy: Secondary | ICD-10-CM | POA: Diagnosis not present

## 2015-03-19 DIAGNOSIS — M1990 Unspecified osteoarthritis, unspecified site: Secondary | ICD-10-CM | POA: Diagnosis not present

## 2015-03-19 DIAGNOSIS — I10 Essential (primary) hypertension: Secondary | ICD-10-CM | POA: Diagnosis not present

## 2015-03-19 LAB — CBC WITH DIFFERENTIAL/PLATELET
BASOS PCT: 0 % (ref 0–1)
Basophils Absolute: 0 10*3/uL (ref 0.0–0.1)
EOS ABS: 0.1 10*3/uL (ref 0.0–0.7)
Eosinophils Relative: 2 % (ref 0–5)
HCT: 45.2 % (ref 39.0–52.0)
Hemoglobin: 15.1 g/dL (ref 13.0–17.0)
LYMPHS ABS: 1.6 10*3/uL (ref 0.7–4.0)
Lymphocytes Relative: 31 % (ref 12–46)
MCH: 26.7 pg (ref 26.0–34.0)
MCHC: 33.4 g/dL (ref 30.0–36.0)
MCV: 80 fL (ref 78.0–100.0)
Monocytes Absolute: 0.5 10*3/uL (ref 0.1–1.0)
Monocytes Relative: 9 % (ref 3–12)
Neutro Abs: 3 10*3/uL (ref 1.7–7.7)
Neutrophils Relative %: 58 % (ref 43–77)
Platelets: 167 10*3/uL (ref 150–400)
RBC: 5.65 MIL/uL (ref 4.22–5.81)
RDW: 13.8 % (ref 11.5–15.5)
WBC: 5.2 10*3/uL (ref 4.0–10.5)

## 2015-03-19 LAB — COMPREHENSIVE METABOLIC PANEL
ALBUMIN: 3.5 g/dL (ref 3.5–5.2)
ALK PHOS: 51 U/L (ref 39–117)
ALT: 19 U/L (ref 0–53)
AST: 18 U/L (ref 0–37)
Anion gap: 7 (ref 5–15)
BUN: 14 mg/dL (ref 6–23)
CALCIUM: 9 mg/dL (ref 8.4–10.5)
CO2: 23 mmol/L (ref 19–32)
Chloride: 104 mmol/L (ref 96–112)
Creatinine, Ser: 0.71 mg/dL (ref 0.50–1.35)
GFR calc Af Amer: >90 mL/min (ref >90)
Glucose, Bld: 107 mg/dL — ABNORMAL HIGH (ref 70–99)
Potassium: 3.8 mmol/L (ref 3.5–5.1)
SODIUM: 134 mmol/L — AB (ref 135–145)
Total Bilirubin: 0.6 mg/dL (ref 0.3–1.2)
Total Protein: 6.8 g/dL (ref 6.0–8.3)

## 2015-03-19 LAB — URINALYSIS, ROUTINE W REFLEX MICROSCOPIC
Bilirubin Urine: NEGATIVE
Glucose, UA: NEGATIVE mg/dL
Hgb urine dipstick: NEGATIVE
Ketones, ur: NEGATIVE mg/dL
Leukocytes, UA: NEGATIVE
Nitrite: NEGATIVE
PH: 7 (ref 5.0–8.0)
PROTEIN: NEGATIVE mg/dL
Specific Gravity, Urine: 1.01 (ref 1.005–1.030)
Urobilinogen, UA: 0.2 mg/dL (ref 0.0–1.0)

## 2015-03-19 LAB — LIPASE, BLOOD: Lipase: 28 U/L (ref 11–59)

## 2015-03-19 LAB — I-STAT TROPONIN, ED: Troponin i, poc: 0.01 ng/mL (ref 0.00–0.08)

## 2015-03-19 LAB — I-STAT CG4 LACTIC ACID, ED: LACTIC ACID, VENOUS: 0.7 mmol/L (ref 0.5–2.0)

## 2015-03-19 MED ORDER — RANITIDINE HCL 150 MG/10ML PO SYRP
150.0000 mg | ORAL_SOLUTION | Freq: Once | ORAL | Status: AC
  Administered 2015-03-19: 150 mg via ORAL
  Filled 2015-03-19: qty 10

## 2015-03-19 MED ORDER — SODIUM CHLORIDE 0.9 % IV BOLUS (SEPSIS)
1000.0000 mL | Freq: Once | INTRAVENOUS | Status: AC
  Administered 2015-03-19: 1000 mL via INTRAVENOUS

## 2015-03-19 MED ORDER — METOCLOPRAMIDE HCL 10 MG PO TABS
10.0000 mg | ORAL_TABLET | Freq: Once | ORAL | Status: AC
  Administered 2015-03-19: 10 mg via ORAL
  Filled 2015-03-19: qty 1

## 2015-03-19 MED ORDER — ALUM & MAG HYDROXIDE-SIMETH 200-200-20 MG/5ML PO SUSP
30.0000 mL | Freq: Once | ORAL | Status: AC
  Administered 2015-03-19: 30 mL via ORAL
  Filled 2015-03-19: qty 30

## 2015-03-19 MED ORDER — ALUM & MAG HYDROXIDE-SIMETH 200-200-20 MG/5ML PO SUSP: 30.0000 mL | Freq: Two times a day (BID) | ORAL | Status: DC | PRN

## 2015-03-19 NOTE — Discharge Instructions (Signed)
Gastritis, Adult   Mr. Dyane DustmanBawa, jininka Rohm and Haasaiki da kuma xrays kasance al'ada. Take magani a matsayin wajabta gastritis da kuma ganin a primary kula likita cikin 3 days for follow up. Idan cututtuka worsen, dawo da gaggawa Department [employer] Usnan da nan. Na gode. Gastritis ne soreness da puffiness (kumburi) na rufi na ciki. Idan ka ba su samu taimako, gastritis zai iya sa zub da jini da kuma sores (ulcers) a ciki. HOME CARE Only yi magani a matsayin shaida wa by [employer] Corporationyour likita. Idan aka ba ka R[place] Hockeykwayoyin magunguna, yi da su kamar yadda ya shaida wa. Gama da [place] [employer] Pipelinemagunguna ko ka fara jin mafi alh?ri. Sha isa ruwaye su kiyaye ka pee (fitsari) share ko kodadde rawaya. Ka guji abinci, Romani[place]uma yan sh cewa yin your matsaloli muni. Foods kana iya kauce wa sun hada da: Caffeine ko barasa. Chocolate. Mint. Tafarnuwa da albasa. Yaji abinci. Citrus 'ya'yan itatuwa, ciki har da lemu, lemons, ko limes. Food dauke Archivistda tumatir, ciki har da Clarendonmiya, barkono, salsa, [employer] Inckuma pizza. Soyayyen da m abinci. Ku ci [place]anan abinci a [place]ina cikin yini a [place]on manyan abinci. SAMU HELP samunsa IF: Kana da baki ko duhu ja poop (stools). Za ka jefa up (aman) jini. Yana iya kama kofi filaye. Ba za ka iya ci gaba da ruwaye down. Your ciki (ciki) ciwo samun muni. Kana da zazzabi. Ba ka jin mafi alh?ri bayan 1 week. Kana da wani tambayoyi ko damuwa. Tabbatar kana: Fahimci wadannan umarnin. Evette CristalShin, b z Futures traderduba da yanayin. Za ka sami taimako samunsa idan ba a ba ka yi da kyau ko samun muni.   Mr. Dyane DustmanBawa, your blood work and xrays were normal.  Take medicine as prescribed for gastritis and see a primary care doctor within 3 days for follow up.  If symptoms worsen, come back to the Emergency Department immediately.  Thank you. Gastritis is soreness and puffiness (inflammation) of the lining of the stomach. If you do not get help, gastritis can cause bleeding and sores (ulcers) in the stomach. HOME CARE   Only take medicine as told by your doctor.  If  you were given antibiotic medicines, take them as told. Finish the medicines even if you start to feel better.  Drink enough fluids to keep your pee (urine) clear or pale yellow.  Avoid foods and drinks that make your problems worse. Foods you may want to avoid include:  Caffeine or alcohol.  Chocolate.  Mint.  Garlic and onions.  Spicy foods.  Citrus fruits, including oranges, lemons, or limes.  Food containing tomatoes, including sauce, chili, salsa, and pizza.  Fried and fatty foods.  Eat small meals throughout the day instead of large meals. GET HELP RIGHT AWAY IF:   You have black or dark red poop (stools).  You throw up (vomit) blood. It may look like coffee grounds.  You cannot keep fluids down.  Your belly (abdominal) pain gets worse.  You have a fever.  You do not feel better after 1 week.  You have any other questions or concerns. MAKE SURE YOU:   Understand these instructions.  Will watch your condition.  Will get help right away if you are not doing well or get worse. Document Released: 05/18/2008 Document Revised: 02/22/2012 Document Reviewed: 01/13/2012 [facility] Patient Information 2015 [place], [place]. This information is not intended to replace advice given to you by your health care provider. Make sure you discuss any questions you have with your health care provider.

## 2015-03-19 NOTE — ED Provider Notes (Signed)
CSN: [id number]     Arrival date & time 03/19/15  0156 History   None    This chart was scribed for Tomasita CrumbleAdeleke Easton Fetty, MD by Arlan OrganAshley Leger, ED Scribe. This patient was seen in room A07C/A07C and the patient's care was started 2:04 AM.   No chief complaint on file.  The history is provided by the patient. No language interpreter was used.    LEVEL 5 CAVEAT DUE TO LANGUAGE BARRIER- Attempted to Tax advisercontract translator but none available at this time. No family present for assistance.  HPI Comments: Gary Quinn is a 64 y.o. male with a PMHx of HTN and arthritis who presents to the Emergency Department complaining of constant, moderate abdominal pain x 1 day. Pt also reports HA. Mr. Gary DustmanBawa has tried 1 dose of Zantac at home without any improvement for symptoms. No recent vomiting or diarrhea. Pt with known allergies to NSAIDS and Aspirin.  Past Medical History  Diagnosis Date  . Hypertension   . Arthritis    No past surgical history on file. No family history on file. History  Substance Use Topics  . Smoking status: Never Smoker   . Smokeless tobacco: Not on file  . Alcohol Use: No    Review of Systems  Unable to perform ROS: Other  All other systems reviewed and are negative.     Allergies  Nsaids and Aspirin  Home Medications   Prior to Admission medications   Medication Sig Start Date End Date Taking? Authorizing Provider  famotidine (PEPCID) 20 MG tablet Take 1 tablet (20 mg total) by mouth 2 (two) times daily. 12/04/14   Charlestine Nighthristopher Lawyer, PA-C  HYDROcodone-acetaminophen (NORCO/VICODIN) 5-325 MG per tablet Take 1 tablet by mouth every 6 (six) hours as needed for moderate pain. 12/04/14   Charlestine Nighthristopher Lawyer, PA-C  omeprazole (PRILOSEC) 20 MG capsule Take 1 capsule (20 mg total) by mouth daily. 12/07/14   Hannah Muthersbaugh, PA-C  ondansetron (ZOFRAN ODT) 8 MG disintegrating tablet 8mg  ODT q4 hours prn nausea 12/07/14   Hannah Muthersbaugh, PA-C  ranitidine (ZANTAC) 150 MG tablet  Take 150 mg by mouth 2 (two) times daily.    Historical Provider, MD  sucralfate (CARAFATE) 1 G tablet Take 1 tablet (1 g total) by mouth 4 (four) times daily -  with meals and at bedtime. 12/04/14   Charlestine Nighthristopher Lawyer, PA-C   Triage Vitals: BP 136/75 mmHg  Pulse 59  Temp(Src) 98.2 F (36.8 C) (Oral)  Resp 24  SpO2 99%   Physical Exam  Constitutional: He is oriented to person, place, and time. He appears well-developed and well-nourished.  HENT:  Head: Normocephalic and atraumatic.  Eyes: EOM are normal.  Neck: Normal range of motion.  Cardiovascular: Normal rate, regular rhythm, normal heart sounds and intact distal pulses.   Pulmonary/Chest: Effort normal and breath sounds normal. No respiratory distress.  Abdominal: Soft. He exhibits no distension. There is tenderness.  Mid epigastric tenderness to palpation  Musculoskeletal: Normal range of motion.  Neurological: He is alert and oriented to person, place, and time.  Skin: Skin is warm and dry.  Psychiatric: He has a normal mood and affect. Judgment normal.  Nursing note and vitals reviewed.   ED Course  Procedures (including critical care time)  DIAGNOSTIC STUDIES: Oxygen Saturation is 99% on RA, Normal by my interpretation.    COORDINATION OF CARE: 2:03 AM-Discussed treatment plan with pt at bedside and pt agreed to plan.     Labs Review Labs Reviewed  COMPREHENSIVE METABOLIC PANEL -  Abnormal; Notable for the following:    Sodium 134 (*)    Glucose, Bld 107 (*)    All other components within normal limits  CBC WITH DIFFERENTIAL/PLATELET  LIPASE, BLOOD  URINALYSIS, ROUTINE W REFLEX MICROSCOPIC  I-STAT CG4 LACTIC ACID, ED  Rosezena Sensor, ED    Imaging Review Dg Abd 2 Views  03/19/2015   CLINICAL DATA:  Abdominal pain and head pain. Patient does not speak English and cannot provided history.  EXAM: ABDOMEN - 2 VIEW  COMPARISON:  CT abdomen and pelvis 12/06/2014  FINDINGS: Scattered gas and stool in the colon. No  small or large bowel distention. No free intra-abdominal air. No abnormal air-fluid levels. No radiopaque stones. Visualized bones appear intact.  IMPRESSION: Normal nonobstructive bowel gas pattern.   Electronically Signed   By: Burman Nieves M.D.   On: 03/19/2015 02:54     EKG Interpretation   Date/Time:  Tuesday March 19 2015 02:11:20 EDT Ventricular Rate:  61 PR Interval:  208 QRS Duration: 92 QT Interval:  401 QTC Calculation: 404 R Axis:   -25 Text Interpretation:  Sinus rhythm Borderline left axis deviation No  significant change since last tracing Confirmed by Erroll Luna  [id number]) on 03/19/2015 3:03:23 AM      MDM   Final diagnoses:  None    patient since emergency department for abdominal pain and headache. He speaks [place]  And translator is not available at this time of night. He has had multiple visits for this in the past, his most recent CT scan was in December which was negative. This scan only revealed gastritis. This consistent with his midepigastric pain now. He was given Reglan, Maalox emergency department. He states he took Zantac at home. Upon repeat evaluation, the patient states his pain has improved. He is advised to continue taking Maalox with his Zantac at home, prescription will be provided. Currently he has no peritoneal signs, his abdomen is soft, I do not believe he warrants repeat CT scan. His vital signs remain within his normal limits and he is safe for discharge and primary care follow-up within 3 days.  I personally performed the services described in this documentation, which was scribed in my presence. The recorded information has been reviewed and is accurate.   Tomasita Crumble, MD 03/19/15 [phone]

## 2015-03-19 NOTE — ED Notes (Signed)
Per EMS patient called ems to home for abdominal pain and head pain. Patient does not speak english at all. Patient speaks [place], can not get an interpreter for language at this time of night.  BP 162/87 HR 64 RR 18 98% CBG 114

## 2015-03-19 NOTE — ED Notes (Signed)
Pt. Left with all belongings and refused wheelchair 

## 2015-04-08 ENCOUNTER — Emergency Department (HOSPITAL_COMMUNITY)
Admission: EM | Admit: 2015-04-08 | Discharge: 2015-04-08 | Disposition: A | Discharge status: Home / Self Care | Payer: Commercial Managed Care - PPO | Attending: Emergency Medicine | Admitting: Emergency Medicine

## 2015-04-08 ENCOUNTER — Encounter (HOSPITAL_COMMUNITY): Payer: Self-pay | Admitting: *Deleted

## 2015-04-08 DIAGNOSIS — K2970 Gastritis, unspecified, without bleeding: Secondary | ICD-10-CM

## 2015-04-08 DIAGNOSIS — N4889 Other specified disorders of penis: Secondary | ICD-10-CM | POA: Insufficient documentation

## 2015-04-08 DIAGNOSIS — I10 Essential (primary) hypertension: Secondary | ICD-10-CM | POA: Insufficient documentation

## 2015-04-08 DIAGNOSIS — R0602 Shortness of breath: Secondary | ICD-10-CM | POA: Diagnosis not present

## 2015-04-08 DIAGNOSIS — Y9289 Other specified places as the place of occurrence of the external cause: Secondary | ICD-10-CM | POA: Insufficient documentation

## 2015-04-08 DIAGNOSIS — X58XXXA Exposure to other specified factors, initial encounter: Secondary | ICD-10-CM | POA: Diagnosis not present

## 2015-04-08 DIAGNOSIS — T7840XA Allergy, unspecified, initial encounter: Secondary | ICD-10-CM | POA: Diagnosis not present

## 2015-04-08 DIAGNOSIS — Y998 Other external cause status: Secondary | ICD-10-CM | POA: Diagnosis not present

## 2015-04-08 DIAGNOSIS — Y9389 Activity, other specified: Secondary | ICD-10-CM | POA: Diagnosis not present

## 2015-04-08 DIAGNOSIS — Z79899 Other long term (current) drug therapy: Secondary | ICD-10-CM | POA: Insufficient documentation

## 2015-04-08 DIAGNOSIS — M1990 Unspecified osteoarthritis, unspecified site: Secondary | ICD-10-CM | POA: Diagnosis not present

## 2015-04-08 DIAGNOSIS — R1013 Epigastric pain: Secondary | ICD-10-CM | POA: Diagnosis present

## 2015-04-08 LAB — URINALYSIS, ROUTINE W REFLEX MICROSCOPIC
Bilirubin Urine: NEGATIVE
Glucose, UA: NEGATIVE mg/dL
Hgb urine dipstick: NEGATIVE
Ketones, ur: NEGATIVE mg/dL
Leukocytes, UA: NEGATIVE
Nitrite: NEGATIVE
PH: 7 (ref 5.0–8.0)
Protein, ur: NEGATIVE mg/dL
Specific Gravity, Urine: 1.013 (ref 1.005–1.030)
Urobilinogen, UA: 0.2 mg/dL (ref 0.0–1.0)

## 2015-04-08 LAB — I-STAT TROPONIN, ED: TROPONIN I, POC: 0 ng/mL (ref 0.00–0.08)

## 2015-04-08 LAB — CBC WITH DIFFERENTIAL/PLATELET
BASOS ABS: 0 10*3/uL (ref 0.0–0.1)
Basophils Relative: 0 % (ref 0–1)
Eosinophils Absolute: 0.1 10*3/uL (ref 0.0–0.7)
Eosinophils Relative: 2 % (ref 0–5)
HCT: 47.2 % (ref 39.0–52.0)
Hemoglobin: 15.9 g/dL (ref 13.0–17.0)
LYMPHS ABS: 3.1 10*3/uL (ref 0.7–4.0)
Lymphocytes Relative: 46 % (ref 12–46)
MCH: 27 pg (ref 26.0–34.0)
MCHC: 33.7 g/dL (ref 30.0–36.0)
MCV: 80.3 fL (ref 78.0–100.0)
MONOS PCT: 12 % (ref 3–12)
Monocytes Absolute: 0.8 10*3/uL (ref 0.1–1.0)
NEUTROS ABS: 2.7 10*3/uL (ref 1.7–7.7)
Neutrophils Relative %: 40 % — ABNORMAL LOW (ref 43–77)
Platelets: 185 10*3/uL (ref 150–400)
RBC: 5.88 MIL/uL — ABNORMAL HIGH (ref 4.22–5.81)
RDW: 14 % (ref 11.5–15.5)
WBC: 6.7 10*3/uL (ref 4.0–10.5)

## 2015-04-08 LAB — COMPREHENSIVE METABOLIC PANEL
ALK PHOS: 52 U/L (ref 39–117)
ALT: 24 U/L (ref 0–53)
ANION GAP: 10 (ref 5–15)
AST: 25 U/L (ref 0–37)
Albumin: 3.8 g/dL (ref 3.5–5.2)
BUN: 15 mg/dL (ref 6–23)
CHLORIDE: 99 mmol/L (ref 96–112)
CO2: 25 mmol/L (ref 19–32)
Calcium: 9.2 mg/dL (ref 8.4–10.5)
Creatinine, Ser: 0.9 mg/dL (ref 0.50–1.35)
GFR calc non Af Amer: 89 mL/min — ABNORMAL LOW (ref >90)
Glucose, Bld: 119 mg/dL — ABNORMAL HIGH (ref 70–99)
Potassium: 3.9 mmol/L (ref 3.5–5.1)
Sodium: 134 mmol/L — ABNORMAL LOW (ref 135–145)
Total Bilirubin: 0.6 mg/dL (ref 0.3–1.2)
Total Protein: 7.8 g/dL (ref 6.0–8.3)

## 2015-04-08 LAB — LIPASE, BLOOD: Lipase: 34 U/L (ref 11–59)

## 2015-04-08 MED ORDER — RANITIDINE HCL 150 MG/10ML PO SYRP
300.0000 mg | ORAL_SOLUTION | Freq: Once | ORAL | Status: AC
  Administered 2015-04-08: 300 mg via ORAL
  Filled 2015-04-08: qty 20

## 2015-04-08 MED ORDER — EPINEPHRINE 0.3 MG/0.3ML IJ SOAJ
0.3000 mg | Freq: Once | INTRAMUSCULAR | Status: AC
  Administered 2015-04-08: 0.3 mg via INTRAMUSCULAR
  Filled 2015-04-08: qty 0.3

## 2015-04-08 MED ORDER — PREDNISONE 20 MG PO TABS: 60.0000 mg | ORAL_TABLET | Freq: Every day | ORAL | Status: AC

## 2015-04-08 MED ORDER — EPINEPHRINE 0.3 MG/0.3ML IJ SOAJ: 0.3000 mg | Freq: Once | INTRAMUSCULAR | Status: AC

## 2015-04-08 MED ORDER — PREDNISONE 20 MG PO TABS
60.0000 mg | ORAL_TABLET | Freq: Once | ORAL | Status: AC
  Administered 2015-04-08: 60 mg via ORAL
  Filled 2015-04-08: qty 3

## 2015-04-08 MED ORDER — PANTOPRAZOLE SODIUM 20 MG PO TBEC: 20.0000 mg | DELAYED_RELEASE_TABLET | Freq: Two times a day (BID) | ORAL | Status: AC

## 2015-04-08 MED ORDER — ALUM & MAG HYDROXIDE-SIMETH 200-200-20 MG/5ML PO SUSP
30.0000 mL | Freq: Once | ORAL | Status: AC
  Administered 2015-04-08: 30 mL via ORAL
  Filled 2015-04-08: qty 30

## 2015-04-08 NOTE — ED Notes (Signed)
Pt now also c/o penile swelling.  Tried to call hospital interpreter, no interpreter available for 3 hours.

## 2015-04-08 NOTE — ED Provider Notes (Signed)
CSN: [id number]     Arrival date & time 04/08/15  0120 History  This chart was scribed for Gary Quinn Gary Everson, MD by Tanda RockersMargaux Venter, ED Scribe. This patient was seen in room D36C/D36C and the patient's care was started at 1:42 AM.    Chief Complaint  Patient presents with  . Pruritis  . Facial Swelling  . Abdominal Pain   The history is provided by the patient. The history is limited by a language barrier. No language interpreter was used.     LEVEL 5 CAVEAT Attempted to contact translator who is not available for the next 3 hours. No family present for assistance.   HPI Comments: Willaim Banemadou Quinn is a 64 y.o. male who presents to the Emergency Department complaining of sudden onset epigastric pain that began earlier today. Pt also complains of lip swelling and facial swelling that is gradually improving. He states that since the swelling in his lips subsided, he began having penile swelling which is not improving. Pt complains of an itching sensation to his face. He also complains of mild shortness of breath. He denies penile pain. He was seen here on 4/5 (3 weeks ago) for abdominal pain. He states that his abdominal pain went away for a little while but has been returning every day since.    Past Medical History  Diagnosis Date  . Hypertension   . Arthritis    History reviewed. No pertinent past surgical history. No family history on file. History  Substance Use Topics  . Smoking status: Never Smoker   . Smokeless tobacco: Not on file  . Alcohol Use: No    Review of Systems  Unable to perform ROS: Other       Allergies  Nsaids and Aspirin  Home Medications   Prior to Admission medications   Medication Sig Start Date End Date Taking? Authorizing Provider  alum & mag hydroxide-simeth (MAALOX/MYLANTA) 200-200-20 MG/5ML suspension Take 30 mLs by mouth 2 (two) times daily as needed for indigestion or heartburn. 03/19/15   Gary Quinn Esther Bradstreet, MD  famotidine (PEPCID) 20 MG tablet Take 1 tablet  (20 mg total) by mouth 2 (two) times daily. 12/04/14   Charlestine Nighthristopher Lawyer, PA-C  HYDROcodone-acetaminophen (NORCO/VICODIN) 5-325 MG per tablet Take 1 tablet by mouth every 6 (six) hours as needed for moderate pain. 12/04/14   Charlestine Nighthristopher Lawyer, PA-C  omeprazole (PRILOSEC) 20 MG capsule Take 1 capsule (20 mg total) by mouth daily. 12/07/14   Hannah Muthersbaugh, PA-C  ondansetron (ZOFRAN ODT) 8 MG disintegrating tablet 8mg  ODT q4 hours prn nausea 12/07/14   Hannah Muthersbaugh, PA-C  ranitidine (ZANTAC) 150 MG tablet Take 150 mg by mouth 2 (two) times daily.    Historical Provider, MD  sucralfate (CARAFATE) 1 G tablet Take 1 tablet (1 g total) by mouth 4 (four) times daily -  with meals and at bedtime. 12/04/14   Charlestine Nighthristopher Lawyer, PA-C   Triage Vitals: BP 150/83 mmHg  Pulse 73  Temp(Src) 97.6 F (36.4 C) (Oral)  Resp 18  SpO2 99%   Physical Exam  Constitutional: He is oriented to person, place, and time. Vital signs are normal. He appears well-developed and well-nourished.  Non-toxic appearance. He does not appear ill. No distress.      HENT:  Head: Normocephalic and atraumatic.  Nose: Nose normal.  Mouth/Throat: Oropharynx is clear and moist. No oropharyngeal exudate.  Upper lip is mildly swollen. No swelling in oropharynx.   Eyes: Conjunctivae and EOM are normal. Pupils are equal, round, and reactive  to light. No scleral icterus.  Neck: Normal range of motion. Neck supple. No tracheal deviation, no edema, no erythema and normal range of motion present. No thyroid mass and no thyromegaly present.  Cardiovascular: Normal rate, regular rhythm, S1 normal, S2 normal, normal heart sounds, intact distal pulses and normal pulses.  Exam reveals no gallop and no friction rub.   No murmur heard. Pulses:      Radial pulses are 2+ on the right side, and 2+ on the left side.       Dorsalis pedis pulses are 2+ on the right side, and 2+ on the left side.  Pulmonary/Chest: Effort normal and breath  sounds normal. No respiratory distress. He has no wheezes. He has no rhonchi. He has no rales.  Abdominal: Soft. Normal appearance and bowel sounds are normal. He exhibits no distension, no ascites and no mass. There is no hepatosplenomegaly. There is no tenderness. There is no rebound, no guarding and no CVA tenderness.  Genitourinary:  Shaft of penis has some soft tissue swelling;  no tenderness; no discharge. No scrotal swelling or tenderness.   Musculoskeletal: Normal range of motion. He exhibits no edema or tenderness.  Lymphadenopathy:    He has no cervical adenopathy.  Neurological: He is alert and oriented to person, place, and time. He has normal strength. No cranial nerve deficit or sensory deficit.  Skin: Skin is warm, dry and intact. No petechiae and no rash noted. He is not diaphoretic. No erythema. No pallor.  Psychiatric: He has a normal mood and affect. His behavior is normal. Judgment normal.  Nursing note and vitals reviewed.   ED Course  Procedures (including critical care time)  DIAGNOSTIC STUDIES: Oxygen Saturation is 99% on RA, normal by my interpretation.    COORDINATION OF CARE: 1:51 AM-Discussed treatment plan which includes UA, CBC, CMP, Lipase, Troponin with pt at bedside and pt agreed to plan.   Labs Review Labs Reviewed  CBC WITH DIFFERENTIAL/PLATELET - Abnormal; Notable for the following:    RBC 5.88 (*)    Neutrophils Relative % 40 (*)    All other components within normal limits  COMPREHENSIVE METABOLIC PANEL - Abnormal; Notable for the following:    Sodium 134 (*)    Glucose, Bld 119 (*)    GFR calc non Af Amer 89 (*)    All other components within normal limits  LIPASE, BLOOD  URINALYSIS, ROUTINE W REFLEX MICROSCOPIC  I-STAT TROPOININ, ED    Imaging Review No results found.   EKG Interpretation None      MDM   Final diagnoses:  None  Patient presents for chronic abdominal pain that he states is consistent with his gastritis.  He was  given ranitidine and maalox for treatment. He also appears to of had an allergic reaction today, although history is difficult to obtain as the patient speaks Hausa and no translator is available in our phone service.  He describes his lips and penis swelling. There was never airway involvement and he states he breathing was fine.  His symptoms have improved now, but there is still some swelling.  Will treat with epi pen in case this was anaphylaxis at one point.  He received steroids as well and will be sent home with 5 day course.  HE normally takes famotidine at home which is not working.  I changed his medication to protonix and provided Rx.  PCP fu was given and advised within 3 days.  His VS remain within his normal  limits and he is safe for DC.  CRITICAL CARE Performed by: Gary Crumble   Total critical care time: - epi pen for anaphylaxis  Critical care time was exclusive of separately billable procedures and treating other patients.  Critical care was necessary to treat or prevent imminent or life-threatening deterioration.  Critical care was time spent personally by me on the following activities: development of treatment plan with patient and/or surrogate as well as nursing, discussions with consultants, evaluation of patient's response to treatment, examination of patient, obtaining history from patient or surrogate, ordering and performing treatments and interventions, ordering and review of laboratory studies, ordering and review of radiographic studies, pulse oximetry and re-evaluation of patient's condition.    I personally performed the services described in this documentation, which was scribed in my presence. The recorded information has been reviewed and is accurate.   Gary Crumble, MD 04/08/15 1550

## 2015-04-08 NOTE — Discharge Instructions (Signed)
Gastritis, Adult Gary Quinn, if you have another allergic reaction, use the epinephrine pen. Take the prednisone medication for the next 4 days. Take Protonix for your abdominal pain and stopped taking the famotidine. See a primary care physician within 3 days for close follow-up. If symptoms worsen come back to emergency department immediately. Thank you.   Gary Quinn , idan kana da wani rashin lafiyan dauki , yi amfani da epinephrine alkalami. A kai da prednisone magani domin na gaba 4 days . Take Protonix for your ciki zafi kuma tsaya shan famotidine . Dubi a primary kula likita cikin 3 days for kusa follow -up. Idan cututtuka worsen komo gaggawa [place]. Na gode.  Gastritis is soreness and puffiness (inflammation) of the lining of the stomach. If you do not get help, gastritis can cause bleeding and sores (ulcers) in the stomach. HOME CARE   Only take medicine as told by your doctor.  If you were given antibiotic medicines, take them as told. Finish the medicines even if you start to feel better.  Drink enough fluids to keep your pee (urine) clear or pale yellow.  Avoid foods and drinks that make your problems worse. Foods you may want to avoid include:  Caffeine or alcohol.  Chocolate.  Mint.  Garlic and onions.  Spicy foods.  Citrus fruits, including oranges, lemons, or limes.  Food containing tomatoes, including sauce, chili, salsa, and pizza.  Fried and fatty foods.  Eat small meals throughout the day instead of large meals. GET HELP RIGHT AWAY IF:   You have black or dark red poop (stools).  You throw up (vomit) blood. It may look like coffee grounds.  You cannot keep fluids down.  Your belly (abdominal) pain gets worse.  You have a fever.  You do not feel better after 1 week.  You have any other questions or concerns. MAKE SURE YOU:   Understand these instructions.  Will watch your condition.  Will get help right away if you are not doing well or  get worse. Document Released: 05/18/2008 Document Revised: 02/22/2012 Document Reviewed: 01/13/2012 [facility] Patient Information 2015 [place], [place]. This information is not intended to replace advice given to you by your health care provider. Make sure you discuss any questions you have with your health care provider. Anaphylactic Reaction An anaphylactic reaction is a sudden, severe allergic reaction. It affects the whole body. It can be life threatening. You may need to stay in the hospital.  Kyle a medical bracelet or necklace that lists your allergy.  Carry your allergy kit or medicine shot to treat severe allergic reactions with you. These can save your life.  Do not drive until medicine from your shot has worn off, unless your doctor says it is okay.  If you have hives or a rash:  Take medicine as told by your doctor.  You may take over-the-counter antihistamine medicine.  Place cold cloths on your skin. Take baths in cool water. Avoid hot baths and hot showers. GET HELP RIGHT AWAY IF:   Your mouth is puffy (swollen), or you have trouble breathing.  You start making whistling sounds when you breathe (wheezing).  You have a tight feeling in your chest or throat.  You have a rash, hives, puffiness, or itching on your body.  You throw up (vomit) or have watery poop (diarrhea).  You feel dizzy or pass out (faint).  You think you are having an allergic reaction.  You have new symptoms.  This is an emergency. Use your medicine shot or allergy kit as told. Call your local emergency services (911 in [place] Even if you feel better after the shot, you need to go to the hospital emergency department. MAKE SURE YOU:   Understand these instructions.  Will watch your condition.  Will get help right away if you are not doing well or get worse. Document Released: 05/18/2008 Document Revised: 05/31/2012 Document Reviewed: 03/02/2012 [facility] Patient Information 2015  [place], [place]. This information is not intended to replace advice given to you by your health care provider. Make sure you discuss any questions you have with your health care provider.

## 2015-04-08 NOTE — ED Notes (Signed)
pt c/o itching to all of body and swollen lip. Also c/o upper abd pain.
